# Patient Record
Sex: Female | Born: 1960
Health system: Southern US, Community
[De-identification: ages and names within clinical notes are randomized; demographics above are authoritative.]

## PROBLEM LIST (undated history)

## (undated) DIAGNOSIS — T7840XA Allergy, unspecified, initial encounter: Secondary | ICD-10-CM

## (undated) DIAGNOSIS — D649 Anemia, unspecified: Secondary | ICD-10-CM

## (undated) HISTORY — DX: Allergy, unspecified, initial encounter: T78.40XA

---
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
## 2013-09-22 ENCOUNTER — Encounter: Payer: Self-pay | Admitting: *Deleted

## 2013-09-24 ENCOUNTER — Ambulatory Visit (INDEPENDENT_AMBULATORY_CARE_PROVIDER_SITE_OTHER): Payer: BC Managed Care – PPO | Admitting: Cardiology

## 2013-09-24 ENCOUNTER — Ambulatory Visit: Payer: Self-pay | Admitting: Cardiology

## 2013-09-24 ENCOUNTER — Encounter: Payer: Self-pay | Admitting: Cardiology

## 2013-09-24 VITALS — BP 126/82 | HR 76 | Ht 61.0 in | Wt 138.0 lb

## 2013-09-24 DIAGNOSIS — R0789 Other chest pain: Secondary | ICD-10-CM

## 2013-09-24 MED ORDER — RANITIDINE HCL 150 MG PO TABS: 150.0000 mg | ORAL_TABLET | Freq: Two times a day (BID) | ORAL | Status: DC

## 2013-09-24 NOTE — Patient Instructions (Signed)
Your physician recommends that you schedule a follow-up appointment in: 3 months. Your physician has recommended you make the following change in your medication: Start zantac 150 mg twice daily. This medication is over the counter. All other medications will remain the same.

## 2013-09-24 NOTE — Progress Notes (Signed)
Clinical Summary [name] is a 53 y.o.female seen today as a new patient for chest pain.   1. Chest pain - pain started 1 month ago. Sharp or dull pain, 1/10. Mid to left chest, no other associated symptoms. Can occur at rest or with exertion. Nothing makes better or worst. No change in frequency or severity since onset. - denies any DOE, no orthopnea, no LE edema.  CAD: No HTN, no HL, no tobacco,  no DM. Mother age 54 with newly found coronary blockage.     No past medical history on file.   Allergies  Allergen Reactions  . Amoxicillin      Current Outpatient Prescriptions  Medication Sig Dispense Refill  . MISC NATURAL PRODUCTS PO Take 1 capsule by mouth 3 (three) times daily. cayan pepper      . Multiple Vitamin (MULTIVITAMIN) tablet Take 0.5 tablets by mouth 2 (two) times daily.      . Omega-3 Fatty Acids (FISH OIL) 1000 MG CAPS Take 1 capsule by mouth 3 (three) times daily.      . ranitidine (ZANTAC) 150 MG tablet Take 1 tablet (150 mg total) by mouth 2 (two) times daily.  60 tablet  6   No current facility-administered medications for this visit.     Past Surgical History  Procedure Laterality Date  . Cesarean section with bilateral tubal ligation  1995     Allergies  Allergen Reactions  . Amoxicillin       Family History  Problem Relation Age of Onset  . CAD Mother     95% blockages had cardiac catherization  . Hypertension Mother   . Cataracts Mother   . Lung cancer Father   . Arthritis Mother      Social History Rachel Brooks reports that she has never smoked. She does not have any smokeless tobacco history on file. [name] has no alcohol history on file.   Review of Systems CONSTITUTIONAL: No weight loss, fever, chills, weakness or fatigue.  HEENT: Eyes: No visual loss, blurred vision, double vision or yellow sclerae.No hearing loss, sneezing, congestion, runny nose or sore throat.  SKIN: No rash or itching.  CARDIOVASCULAR: per  HPI RESPIRATORY: No shortness of breath, cough or sputum.  GASTROINTESTINAL: No anorexia, nausea, vomiting or diarrhea. No abdominal pain or blood.  GENITOURINARY: No burning on urination, no polyuria NEUROLOGICAL: No headache, dizziness, syncope, paralysis, ataxia, numbness or tingling in the extremities. No change in bowel or bladder control.  MUSCULOSKELETAL: No muscle, back pain, joint pain or stiffness.  LYMPHATICS: No enlarged nodes. No history of splenectomy.  PSYCHIATRIC: No history of depression or anxiety.  ENDOCRINOLOGIC: No reports of sweating, cold or heat intolerance. No polyuria or polydipsia.  Marland Kitchen.   Physical Examination Filed Vitals:   09/24/13 1437  BP: 126/82  Pulse: 76   Filed Weights   09/24/13 1437  Weight: 138 lb (62.596 kg)    Gen: resting comfortably, no acute distress HEENT: no scleral icterus, pupils equal round and reactive, no palptable cervical adenopathy,  CV: RRR, no m/r/g, no  JVD Resp: Clear to auscultation bilaterally GI: abdomen is soft, non-tender, non-distended, normal bowel sounds, no hepatosplenomegaly MSK: extremities are warm, no edema.  Skin: warm, no rash Neuro:  no focal deficits Psych: appropriate affect   Diagnostic Studies EKG 09/13/13: NSR    Assessment and Plan  1. Atypical chest pain - atypical chest pain, no significant risk factors - do not recommend further cardiac workup at  this time - f/u 3 months to reassess symptoms - start zantac emperically for possible GI etiology.       Antoine Poche, M.D., F.A.C.C.

## 2013-10-25 DIAGNOSIS — R0789 Other chest pain: Secondary | ICD-10-CM | POA: Insufficient documentation

## 2013-12-27 ENCOUNTER — Ambulatory Visit: Payer: BC Managed Care – PPO | Admitting: Cardiology

## 2015-06-26 DIAGNOSIS — D649 Anemia, unspecified: Secondary | ICD-10-CM | POA: Insufficient documentation

## 2015-06-26 DIAGNOSIS — Z780 Asymptomatic menopausal state: Secondary | ICD-10-CM | POA: Insufficient documentation

## 2015-06-26 DIAGNOSIS — R7309 Other abnormal glucose: Secondary | ICD-10-CM | POA: Insufficient documentation

## 2015-06-26 DIAGNOSIS — Z1211 Encounter for screening for malignant neoplasm of colon: Secondary | ICD-10-CM | POA: Insufficient documentation

## 2015-06-26 DIAGNOSIS — Z0000 Encounter for general adult medical examination without abnormal findings: Secondary | ICD-10-CM | POA: Insufficient documentation

## 2015-06-26 HISTORY — DX: Anemia, unspecified: D64.9

## 2016-12-02 DIAGNOSIS — Z23 Encounter for immunization: Secondary | ICD-10-CM | POA: Insufficient documentation

## 2018-02-18 DIAGNOSIS — R21 Rash and other nonspecific skin eruption: Secondary | ICD-10-CM | POA: Diagnosis not present

## 2018-03-20 DIAGNOSIS — R21 Rash and other nonspecific skin eruption: Secondary | ICD-10-CM | POA: Diagnosis not present

## 2018-04-30 DIAGNOSIS — L508 Other urticaria: Secondary | ICD-10-CM | POA: Diagnosis not present

## 2018-06-09 DIAGNOSIS — Z6825 Body mass index (BMI) 25.0-25.9, adult: Secondary | ICD-10-CM | POA: Diagnosis not present

## 2018-06-09 DIAGNOSIS — Z1231 Encounter for screening mammogram for malignant neoplasm of breast: Secondary | ICD-10-CM | POA: Diagnosis not present

## 2018-06-09 DIAGNOSIS — Z01419 Encounter for gynecological examination (general) (routine) without abnormal findings: Secondary | ICD-10-CM | POA: Diagnosis not present

## 2018-06-12 LAB — HM PAP SMEAR: HM Pap smear: NEGATIVE

## 2018-12-30 DIAGNOSIS — Z880 Allergy status to penicillin: Secondary | ICD-10-CM | POA: Diagnosis not present

## 2018-12-30 DIAGNOSIS — K529 Noninfective gastroenteritis and colitis, unspecified: Secondary | ICD-10-CM | POA: Diagnosis not present

## 2018-12-30 DIAGNOSIS — R109 Unspecified abdominal pain: Secondary | ICD-10-CM | POA: Diagnosis not present

## 2018-12-30 DIAGNOSIS — R7303 Prediabetes: Secondary | ICD-10-CM | POA: Diagnosis not present

## 2018-12-30 DIAGNOSIS — R112 Nausea with vomiting, unspecified: Secondary | ICD-10-CM | POA: Diagnosis not present

## 2018-12-30 DIAGNOSIS — R1032 Left lower quadrant pain: Secondary | ICD-10-CM | POA: Diagnosis not present

## 2019-03-26 ENCOUNTER — Other Ambulatory Visit: Payer: Self-pay

## 2019-03-26 DIAGNOSIS — U071 COVID-19: Secondary | ICD-10-CM | POA: Diagnosis not present

## 2019-03-27 LAB — NOVEL CORONAVIRUS, NAA: SARS-CoV-2, NAA: NOT DETECTED

## 2019-03-31 ENCOUNTER — Ambulatory Visit: Payer: Self-pay | Admitting: Family Medicine

## 2019-04-13 ENCOUNTER — Other Ambulatory Visit: Payer: Self-pay

## 2019-04-14 ENCOUNTER — Ambulatory Visit: Payer: Self-pay | Admitting: Family Medicine

## 2019-05-11 ENCOUNTER — Other Ambulatory Visit: Payer: Self-pay

## 2019-05-13 ENCOUNTER — Encounter: Payer: Self-pay | Admitting: Family Medicine

## 2019-05-13 ENCOUNTER — Ambulatory Visit (INDEPENDENT_AMBULATORY_CARE_PROVIDER_SITE_OTHER): Payer: BC Managed Care – PPO | Admitting: Family Medicine

## 2019-05-13 ENCOUNTER — Other Ambulatory Visit: Payer: Self-pay

## 2019-05-13 VITALS — BP 123/83 | HR 87 | Temp 98.0°F | Wt 150.0 lb

## 2019-05-13 DIAGNOSIS — Z0000 Encounter for general adult medical examination without abnormal findings: Secondary | ICD-10-CM

## 2019-05-13 DIAGNOSIS — Z1211 Encounter for screening for malignant neoplasm of colon: Secondary | ICD-10-CM | POA: Diagnosis not present

## 2019-05-13 DIAGNOSIS — Z23 Encounter for immunization: Secondary | ICD-10-CM

## 2019-05-13 MED ORDER — SHINGRIX 50 MCG/0.5ML IM SUSR: 0.5000 mL | Freq: Once | INTRAMUSCULAR | 0 refills | Status: AC

## 2019-05-13 NOTE — Patient Instructions (Signed)

## 2019-05-13 NOTE — Progress Notes (Signed)
New Patient Office Visit  Assessment & Plan:  1. Healthcare maintenance - Mammogram result requested from [facility]. Patient declined HIV and Hep C screening. Declined any lab work today.   2. Colon cancer screening - Ambulatory referral to Gastroenterology  3. Immunization due - SHINGRIX injection; Inject 0.5 mLs into the muscle once for 1 dose.  Dispense: 0.5 mL; Refill: 0   Follow-up: Return for annual physical.   Hendricks Limes, MSN, APRN, FNP-C [facility]  Subjective:  Patient ID: Rachel Brooks, female    DOB: 02-09-1961  Age: 58 y.o. MRN: [id number]  Patient Care Team: Loman Brooklyn, FNP as PCP - General (Family Medicine) Nat Math, MD as Referring Physician (Obstetrics and Gynecology)  CC:  Chief Complaint  Patient presents with  . New Patient (Initial Visit)    HPI Rachel Brooks presents to establish care. She is transferring care from Dr. Murrell Redden office as he has retired and the office has closed.   Patient has no complaints/concerns today.   Review of Systems  Constitutional: Negative for chills, fever, malaise/fatigue and weight loss.  HENT: Negative for congestion, ear discharge, ear pain, nosebleeds, sinus pain, sore throat and tinnitus.   Eyes: Positive for blurred vision. Negative for double vision, pain, discharge and redness.  Respiratory: Negative for cough, shortness of breath and wheezing.   Cardiovascular: Negative for chest pain, palpitations and leg swelling.  Gastrointestinal: Negative for abdominal pain, constipation, diarrhea, heartburn, nausea and vomiting.  Genitourinary: Negative for dysuria, frequency and urgency.  Musculoskeletal: Negative for myalgias.  Skin: Negative for rash.  Neurological: Negative for dizziness, seizures, weakness and headaches.  Psychiatric/Behavioral: Negative for depression, substance abuse and suicidal ideas. The patient is not nervous/anxious.     Current Outpatient  Medications:  .  SHINGRIX injection, Inject 0.5 mLs into the muscle once for 1 dose., Disp: 0.5 mL, Rfl: 0  Allergies  Allergen Reactions  . Amoxicillin     Past Medical History:  Diagnosis Date  . Allergy   . Anemia, unspecified 06/26/2015   10/1 IMO update    Past Surgical History:  Procedure Laterality Date  . CESAREAN SECTION WITH BILATERAL TUBAL LIGATION  1995    Family History  Problem Relation Age of Onset  . CAD Mother        95% blockages had cardiac catherization  . Hypertension Mother   . Cataracts Mother   . Arthritis Mother   . Lung cancer Father   . Colon polyps Sister     Social History   Socioeconomic History  . Marital status: Married    Spouse name: Not on file  . Number of children: Not on file  . Years of education: Not on file  . Highest education level: Not on file  Occupational History  . Not on file  Social Needs  . Financial resource strain: Not on file  . Food insecurity    Worry: Not on file    Inability: Not on file  . Transportation needs    Medical: Not on file    Non-medical: Not on file  Tobacco Use  . Smoking status: Never Smoker  . Smokeless tobacco: Never Used  Substance and Sexual Activity  . Alcohol use: Not Currently  . Drug use: Never  . Sexual activity: Not Currently  Lifestyle  . Physical activity    Days per week: Not on file    Minutes per session: Not on file  . Stress: Not on file  Relationships  .  Social Musician on phone: Not on file    Gets together: Not on file    Attends religious service: Not on file    Active member of club or organization: Not on file    Attends meetings of clubs or organizations: Not on file    Relationship status: Not on file  . Intimate partner violence    Fear of current or ex partner: Not on file    Emotionally abused: Not on file    Physically abused: Not on file    Forced sexual activity: Not on file  Other Topics Concern  . Not on file  Social History  Narrative  . Not on file    Objective:   Today's Vitals: BP 123/83   Pulse 87   Temp 98 F (36.7 C)   Wt 150 lb (68 kg)   BMI 28.34 kg/m   Physical Exam Vitals signs reviewed.  Constitutional:      General: She is not in acute distress.    Appearance: Normal appearance. She is overweight. She is not ill-appearing, toxic-appearing or diaphoretic.  HENT:     Head: Normocephalic and atraumatic.  Eyes:     General: No scleral icterus.       Right eye: No discharge.        Left eye: No discharge.     Conjunctiva/sclera: Conjunctivae normal.  Neck:     Musculoskeletal: Normal range of motion.  Cardiovascular:     Rate and Rhythm: Normal rate and regular rhythm.     Heart sounds: Normal heart sounds. No murmur. No friction rub. No gallop.   Pulmonary:     Effort: Pulmonary effort is normal. No respiratory distress.     Breath sounds: Normal breath sounds. No stridor. No wheezing, rhonchi or rales.  Musculoskeletal: Normal range of motion.  Skin:    General: Skin is warm and dry.     Capillary Refill: Capillary refill takes less than 2 seconds.  Neurological:     General: No focal deficit present.     Mental Status: She is alert and oriented to person, place, and time. Mental status is at baseline.  Psychiatric:        Mood and Affect: Mood normal.        Behavior: Behavior normal.        Thought Content: Thought content normal.        Judgment: Judgment normal.

## 2019-05-18 ENCOUNTER — Encounter: Payer: Self-pay | Admitting: Internal Medicine

## 2019-06-21 ENCOUNTER — Other Ambulatory Visit: Payer: Self-pay

## 2019-06-21 ENCOUNTER — Ambulatory Visit (INDEPENDENT_AMBULATORY_CARE_PROVIDER_SITE_OTHER): Payer: Self-pay | Admitting: *Deleted

## 2019-06-21 DIAGNOSIS — Z1211 Encounter for screening for malignant neoplasm of colon: Secondary | ICD-10-CM

## 2019-06-21 MED ORDER — PEG 3350-KCL-NA BICARB-NACL 420 G PO SOLR: 4000.0000 mL | Freq: Once | ORAL | 0 refills | Status: AC

## 2019-06-21 NOTE — Patient Instructions (Signed)
Rachel Brooks   Dec 04, 1960 MRN: [id number]    Procedure Date: 09/15/2019 Time to register: 12:00 pm Place to register: [facility] Procedure Time: 1:00 pm Scheduled provider: Dr. Gala Romney  PREPARATION FOR COLONOSCOPY WITH TRI-LYTE SPLIT PREP  Please notify us immediately if you are diabetic, take iron supplements, or if you are on Coumadin or any other blood thinners.    You will need to purchase 1 fleet enema and 1 box of Bisacodyl '5mg'$  tablets.   2 DAYS BEFORE PROCEDURE:  DATE: 09/13/2019   DAY: Monday Begin clear liquid diet AFTER your lunch meal. NO SOLID FOODS after this point.  1 DAY BEFORE PROCEDURE:  DATE: 09/14/2019   DAY: Tuesday Continue clear liquids the entire day - NO SOLID FOOD.    At 2:00 pm:  Take 2 Bisacodyl tablets.   At 4:00pm:  Start drinking your solution. Make sure you mix well per instructions on the bottle. Try to drink 1 (one) 8 ounce glass every 10-15 minutes until you have consumed HALF the jug. You should complete by 6:00pm.You must keep the left over solution refrigerated until completed next day.  Continue clear liquids. You must drink plenty of clear liquids to prevent dehyration and kidney failure.     DAY OF PROCEDURE:   DATE: 09/15/2019  DAY: Wednesday If you take medications for your heart, blood pressure or breathing, you may take these medications.   Five hours before your procedure time @ 8:00 am:  Finish remaining amout of bowel prep, drinking 1 (one) 8 ounce glass every 10-15 minutes until complete. You have two hours to consume remaining prep.   Three hours before your procedure time @ 10:00 am:  Nothing by mouth.   At least one hour before going to the hospital:  Give yourself one Fleet enema. You may take your morning medications with sip of water unless we have instructed otherwise.      Please see below for Dietary Information.  CLEAR LIQUIDS INCLUDE:  Water Jello (NOT red in color)   Ice Popsicles (NOT red in color)    Tea (sugar ok, no milk/cream) Powdered fruit flavored drinks  Coffee (sugar ok, no milk/cream) Gatorade/ Lemonade/ Kool-Aid  (NOT red in color)   Juice: apple, white grape, white cranberry Soft drinks  Clear bullion, consomme, broth (fat free beef/chicken/vegetable)  Carbonated beverages (any kind)  Strained chicken noodle soup Hard Candy   Remember: Clear liquids are liquids that will allow you to see your fingers on the other side of a clear glass. Be sure liquids are NOT red in color, and not cloudy, but CLEAR.  DO NOT EAT OR DRINK ANY OF THE FOLLOWING:  Dairy products of any kind   Cranberry juice Tomato juice / V8 juice   Grapefruit juice Orange juice     Red grape juice  Do not eat any solid foods, including such foods as: cereal, oatmeal, yogurt, fruits, vegetables, creamed soups, eggs, bread, crackers, pureed foods in a blender, etc.   HELPFUL HINTS FOR DRINKING PREP SOLUTION:   Make sure prep is extremely cold. Mix and refrigerate the the morning of the prep. You may also put in the freezer.   You may try mixing some Crystal Light or Country Time Lemonade if you prefer. Mix in small amounts; add more if necessary.  Try drinking through a straw  Rinse mouth with water or a mouthwash between glasses, to remove after-taste.  Try sipping on a cold beverage /ice/ popsicles between glasses of  prep.  Place a piece of sugar-free hard candy in mouth between glasses.  If you become nauseated, try consuming smaller amounts, or stretch out the time between glasses. Stop for 30-60 minutes, then slowly start back drinking.        OTHER INSTRUCTIONS  You will need a responsible adult at least 59 years of age to accompany you and drive you home. This person must remain in the waiting room during your procedure. The hospital will cancel your procedure if you do not have a responsible adult with you.   1. Wear loose fitting clothing that is easily removed. 2. Leave jewelry and  other valuables at home.  3. Remove all body piercing jewelry and leave at home. 4. Total time from sign-in until discharge is approximately 2-3 hours. 5. You should go home directly after your procedure and rest. You can resume normal activities the day after your procedure. 6. The day of your procedure you should not:  Drive  Make legal decisions  Operate machinery  Drink alcohol  Return to work   You may call the office (Dept: [phone]) before 5:00pm, or page the doctor on call ([phone]) after 5:00pm, for further instructions, if necessary.   Insurance Information YOU WILL NEED TO CHECK WITH YOUR INSURANCE COMPANY FOR THE BENEFITS OF COVERAGE YOU HAVE FOR THIS PROCEDURE.  UNFORTUNATELY, NOT ALL INSURANCE COMPANIES HAVE BENEFITS TO COVER ALL OR PART OF THESE TYPES OF PROCEDURES.  IT IS YOUR RESPONSIBILITY TO CHECK YOUR BENEFITS, HOWEVER, WE WILL BE GLAD TO ASSIST YOU WITH ANY CODES YOUR INSURANCE COMPANY MAY NEED.    PLEASE NOTE THAT MOST INSURANCE COMPANIES WILL NOT COVER A SCREENING COLONOSCOPY FOR PEOPLE UNDER THE AGE OF 50  IF YOU HAVE BCBS INSURANCE, YOU MAY HAVE BENEFITS FOR A SCREENING COLONOSCOPY BUT IF POLYPS ARE FOUND THE DIAGNOSIS WILL CHANGE AND THEN YOU MAY HAVE A DEDUCTIBLE THAT WILL NEED TO BE MET. SO PLEASE MAKE SURE YOU CHECK YOUR BENEFITS FOR A SCREENING COLONOSCOPY AS WELL AS A DIAGNOSTIC COLONOSCOPY.  

## 2019-06-21 NOTE — Progress Notes (Signed)
Gastroenterology Pre-Procedure Review  Request Date: 06/21/2019 Requesting Physician: Hendricks Limes, NP @ [facility], Last TCS done about 9 years ago in [facility], pt could not remember who did it, no polyps per pt  PATIENT REVIEW QUESTIONS: The patient responded to the following health history questions as indicated:    1. Diabetes Melitis: no 2. Joint replacements in the past 12 months:no 3. Major health problems in the past 3 months: no 4. Has an artificial valve or MVP: no 5. Has a defibrillator: no 6. Has been advised in past to take antibiotics in advance of a procedure like teeth cleaning: no 7. Family history of colon cancer: no  8. Alcohol Use: no 9. Illicit drug Use: no 10. History of sleep apnea: no  11. History of coronary artery or other vascular stents placed within the last 12 months: no 12. History of any prior anesthesia complications: no 13. There is no height or weight on file to calculate BMI.ht: 5'1 wt: 150 lbs    MEDICATIONS & ALLERGIES:    Patient reports the following regarding taking any blood thinners:   Plavix? no Aspirin? no Coumadin? no Brilinta? no Xarelto? no Eliquis? no Pradaxa? no Savaysa? no Effient? no  Patient confirms/reports the following medications:  Current Outpatient Medications  Medication Sig Dispense Refill  . Multiple Vitamins-Minerals (MULTIVITAMIN ADULT PO) Take by mouth daily.     No current facility-administered medications for this visit.     Patient confirms/reports the following allergies:  Allergies  Allergen Reactions  . Amoxicillin   . Penicillins Hives    No orders of the defined types were placed in this encounter.   AUTHORIZATION INFORMATION Primary Insurance: BCBS [place] ,  [place] #: [id number],  Group #: [id number] Pre-Cert / Auth required: No, not required  SCHEDULE INFORMATION: Procedure has been scheduled as follows:  Date: 09/15/2019, Time: 1:00 Location: [facility] with Dr. Gala Romney  This Gastroenterology  Pre-Precedure Review Form is being routed to the following provider(s): Neil Crouch, PA

## 2019-06-22 DIAGNOSIS — Z01419 Encounter for gynecological examination (general) (routine) without abnormal findings: Secondary | ICD-10-CM | POA: Diagnosis not present

## 2019-06-22 DIAGNOSIS — Z6828 Body mass index (BMI) 28.0-28.9, adult: Secondary | ICD-10-CM | POA: Diagnosis not present

## 2019-06-22 DIAGNOSIS — Z1231 Encounter for screening mammogram for malignant neoplasm of breast: Secondary | ICD-10-CM | POA: Diagnosis not present

## 2019-06-22 NOTE — Progress Notes (Signed)
Ok to schedule.

## 2019-06-22 NOTE — Addendum Note (Signed)
Addended by: Metro Kung on: 06/22/2019 08:53 AM   Modules accepted: Orders, SmartSet

## 2019-09-08 ENCOUNTER — Telehealth: Payer: Self-pay

## 2019-09-08 NOTE — Telephone Encounter (Signed)
PA for TCS scheduled for 09/15/19 submitted via [employer] website. Case pended. Reference# [id number].

## 2019-09-13 NOTE — Telephone Encounter (Signed)
TCS approved. Reference# [id number], valid 2/24-21-12/07/19.

## 2019-09-14 ENCOUNTER — Other Ambulatory Visit (HOSPITAL_COMMUNITY)
Admission: RE | Admit: 2019-09-14 | Discharge: 2019-09-14 | Disposition: A | Discharge status: Home / Self Care | Payer: 59 | Source: Ambulatory Visit | Attending: Internal Medicine | Admitting: Internal Medicine

## 2019-09-14 ENCOUNTER — Other Ambulatory Visit: Payer: Self-pay

## 2019-09-14 DIAGNOSIS — Z01812 Encounter for preprocedural laboratory examination: Secondary | ICD-10-CM | POA: Insufficient documentation

## 2019-09-14 DIAGNOSIS — Z20822 Contact with and (suspected) exposure to covid-19: Secondary | ICD-10-CM | POA: Diagnosis not present

## 2019-09-14 LAB — SARS CORONAVIRUS 2 (TAT 6-24 HRS): SARS Coronavirus 2: NEGATIVE

## 2019-09-15 ENCOUNTER — Encounter (HOSPITAL_COMMUNITY): Payer: Self-pay | Admitting: Internal Medicine

## 2019-09-15 ENCOUNTER — Encounter (HOSPITAL_COMMUNITY)
Admission: RE | Disposition: A | Discharge status: Home / Self Care | Payer: Self-pay | Source: Home / Self Care | Attending: Internal Medicine

## 2019-09-15 ENCOUNTER — Other Ambulatory Visit: Payer: Self-pay

## 2019-09-15 ENCOUNTER — Ambulatory Visit (HOSPITAL_COMMUNITY)
Admission: RE | Admit: 2019-09-15 | Discharge: 2019-09-15 | Disposition: A | Discharge status: Home / Self Care | Payer: 59 | Attending: Internal Medicine | Admitting: Internal Medicine

## 2019-09-15 DIAGNOSIS — K5730 Diverticulosis of large intestine without perforation or abscess without bleeding: Secondary | ICD-10-CM | POA: Insufficient documentation

## 2019-09-15 DIAGNOSIS — Z1211 Encounter for screening for malignant neoplasm of colon: Secondary | ICD-10-CM | POA: Diagnosis not present

## 2019-09-15 HISTORY — PX: COLONOSCOPY: SHX5424

## 2019-09-15 SURGERY — COLONOSCOPY
Anesthesia: Moderate Sedation

## 2019-09-15 MED ORDER — MEPERIDINE HCL 50 MG/ML IJ SOLN
INTRAMUSCULAR | Status: AC
  Filled 2019-09-15: qty 1

## 2019-09-15 MED ORDER — SODIUM CHLORIDE 0.9 % IV SOLN: INTRAVENOUS | Status: DC

## 2019-09-15 MED ORDER — MEPERIDINE HCL 100 MG/ML IJ SOLN
INTRAMUSCULAR | Status: DC | PRN
  Administered 2019-09-15: 15 mg via INTRAVENOUS
  Administered 2019-09-15: 25 mg via INTRAVENOUS

## 2019-09-15 MED ORDER — STERILE WATER FOR IRRIGATION IR SOLN
Status: DC | PRN
  Administered 2019-09-15: 100 mL

## 2019-09-15 MED ORDER — ONDANSETRON HCL 4 MG/2ML IJ SOLN
INTRAMUSCULAR | Status: AC
  Filled 2019-09-15: qty 2

## 2019-09-15 MED ORDER — MIDAZOLAM HCL 5 MG/5ML IJ SOLN
INTRAMUSCULAR | Status: AC
  Filled 2019-09-15: qty 10

## 2019-09-15 MED ORDER — MIDAZOLAM HCL 5 MG/5ML IJ SOLN
INTRAMUSCULAR | Status: DC | PRN
  Administered 2019-09-15 (×2): 2 mg via INTRAVENOUS
  Administered 2019-09-15: 1 mg via INTRAVENOUS

## 2019-09-15 MED ORDER — ONDANSETRON HCL 4 MG/2ML IJ SOLN
INTRAMUSCULAR | Status: DC | PRN
  Administered 2019-09-15: 4 mg via INTRAVENOUS

## 2019-09-15 NOTE — Op Note (Signed)
[facility] Patient Name: Rachel Brooks Procedure Date: 09/15/2019 11:41 AM MRN: [id number] Date of Birth: 04-19-1961 Attending MD: Gennette Pac , MD CSN: [id number] Age: 59 Admit Type: Outpatient Procedure:                Colonoscopy Indications:              Screening for colorectal malignant neoplasm Providers:                Gennette Pac, MD, Edrick Kins, RN, Dyann Ruddle Referring MD:              Medicines:                Midazolam 5 mg IV, Meperidine 40 mg IV, Ondansetron                            4 mg IV Complications:            No immediate complications. Estimated Blood Loss:     Estimated blood loss: none. Procedure:                Pre-Anesthesia Assessment:                           - Prior to the procedure, a History and Physical                            was performed, and patient medications and                            allergies were reviewed. The patient's tolerance of                            previous anesthesia was also reviewed. The risks                            and benefits of the procedure and the sedation                            options and risks were discussed with the patient.                            All questions were answered, and informed consent                            was obtained. Prior Anticoagulants: The patient has                            taken no previous anticoagulant or antiplatelet                            agents. ASA Grade Assessment: II - A patient with  mild systemic disease. After reviewing the risks                            and benefits, the patient was deemed in                            satisfactory condition to undergo the procedure.                           After obtaining informed consent, the colonoscope                            was passed under direct vision. Throughout the                            procedure, the patient's blood  pressure, pulse, and                            oxygen saturations were monitored continuously. The                            [id number]-[id number] ([id number]) scope was introduced through                            the anus and advanced to the the cecum, identified                            by appendiceal orifice and ileocecal valve. The                            colonoscopy was performed without difficulty. The                            patient tolerated the procedure well. The quality                            of the bowel preparation was adequate. The                            ileocecal valve, appendiceal orifice, and rectum                            were photographed. Scope In: 12:33:33 PM Scope Out: 12:44:15 PM Scope Withdrawal Time: 0 hours 7 minutes 6 seconds  Total Procedure Duration: 0 hours 10 minutes 42 seconds  Findings:      The perianal and digital rectal examinations were normal.      One medium-mouthed diverticulum was found in the ascending colon.      The exam was otherwise without abnormality on direct and retroflexion       views. Impression:               - Diverticulosis in the ascending colon.                           -  The examination was otherwise normal on direct                            and retroflexion views.                           - No specimens collected. Moderate Sedation:      Moderate (conscious) sedation was administered by the endoscopy nurse       and supervised by the endoscopist. The following parameters were       monitored: oxygen saturation, heart rate, blood pressure, respiratory       rate, EKG, adequacy of pulmonary ventilation, and response to care.       Total physician intraservice time was 17 minutes. Recommendation:           - Patient has a contact number available for                            emergencies. The signs and symptoms of potential                            delayed complications were discussed with the                             patient. Return to normal activities tomorrow.                            Written discharge instructions were provided to the                            patient.                           - Resume previous diet.                           - Continue present medications.                           - Repeat colonoscopy in 10 years for screening                            purposes.                           - Return to GI office PRN. Procedure Code(s):        --- Professional ---                           [phone], Colonoscopy, flexible; diagnostic, including                            collection of specimen(s) by brushing or washing,                            when performed (separate procedure)  G0500, Moderate sedation services provided by the                            same physician or other qualified health care                            professional performing a gastrointestinal                            endoscopic service that sedation supports,                            requiring the presence of an independent trained                            observer to assist in the monitoring of the                            patient's level of consciousness and physiological                            status; initial 15 minutes of intra-service time;                            patient age 89 years or older (additional time may                            be reported with 84166, as appropriate) Diagnosis Code(s):        --- Professional ---                           Z12.11, Encounter for screening for malignant                            neoplasm of colon                           K57.30, Diverticulosis of large intestine without                            perforation or abscess without bleeding CPT copyright 2019 American Medical Association. All rights reserved. The codes documented in this report are preliminary and upon coder review may  be revised to meet  current compliance requirements. Gerrit Friends. Athony Coppa, MD Gennette Pac, MD 09/15/2019 12:52:18 PM This report has been signed electronically. Number of Addenda: 0

## 2019-09-15 NOTE — H&P (Signed)
@LOGO @   Primary Care Physician:  Loman Brooklyn, FNP Primary Gastroenterologist:  Dr. Gala Romney  Pre-Procedure History & Physical: HPI:  Rachel Brooks is a 59 y.o. female is here for a screening colonoscopy.  Last colonoscopy 10 years ago elsewhere reportedly negative.  No GI symptoms.  No family history of colon cancer.  Past Medical History:  Diagnosis Date  . Allergy   . Anemia, unspecified 06/26/2015   10/1 IMO update    Past Surgical History:  Procedure Laterality Date  . CESAREAN SECTION WITH BILATERAL TUBAL LIGATION  1995    Prior to Admission medications   Medication Sig Start Date End Date Taking? Authorizing Provider  Biotin w/ Vitamins C & E (HAIR/SKIN/NAILS PO) Take 2 tablets by mouth daily. Gummies   Yes [provider]  Cholecalciferol (VITAMIN D3 PO) Take 1 capsule by mouth daily.   Yes [provider]  EVENING PRIMROSE OIL PO Take 1 capsule by mouth daily.   Yes [provider]    Allergies as of 06/22/2019 - Review Complete 06/21/2019  Allergen Reaction Noted  . Amoxicillin  09/22/2013  . Penicillins Hives 06/21/2019    Family History  Problem Relation Age of Onset  . CAD Mother        95% blockages had cardiac catherization  . Hypertension Mother   . Cataracts Mother   . Arthritis Mother   . Lung cancer Father   . Colon polyps Sister     Social History   Socioeconomic History  . Marital status: Divorced    Spouse name: Not on file  . Number of children: Not on file  . Years of education: Not on file  . Highest education level: Not on file  Occupational History  . Not on file  Tobacco Use  . Smoking status: Never Smoker  . Smokeless tobacco: Never Used  Substance and Sexual Activity  . Alcohol use: Not Currently  . Drug use: Never  . Sexual activity: Not Currently  Other Topics Concern  . Not on file  Social History Narrative  . Not on file   Social Determinants of Health   Financial Resource Strain:   .  Difficulty of Paying Living Expenses: Not on file  Food Insecurity:   . Worried About Charity fundraiser in the Last Year: Not on file  . Ran Out of Food in the Last Year: Not on file  Transportation Needs:   . Lack of Transportation (Medical): Not on file  . Lack of Transportation (Non-Medical): Not on file  Physical Activity:   . Days of Exercise per Week: Not on file  . Minutes of Exercise per Session: Not on file  Stress:   . Feeling of Stress : Not on file  Social Connections:   . Frequency of Communication with Friends and Family: Not on file  . Frequency of Social Gatherings with Friends and Family: Not on file  . Attends Religious Services: Not on file  . Active Member of Clubs or Organizations: Not on file  . Attends Archivist Meetings: Not on file  . Marital Status: Not on file  Intimate Partner Violence:   . Fear of Current or Ex-Partner: Not on file  . Emotionally Abused: Not on file  . Physically Abused: Not on file  . Sexually Abused: Not on file    Review of Systems: See HPI, otherwise negative ROS  Physical Exam: There were no vitals taken for this visit. General:   Alert,  Well-developed, well-nourished, pleasant and cooperative in NAD Lungs:  Clear throughout to auscultation.   No wheezes, crackles, or rhonchi. No acute distress. Heart:  Regular rate and rhythm; no murmurs, clicks, rubs,  or gallops. Abdomen:  Soft, nontender and nondistended. No masses, hepatosplenomegaly or hernias noted. Normal bowel sounds, without guarding, and without rebound.    Impression/Plan: Rachel Brooks is now here to undergo a screening colonoscopy.  Average rescreening examination.  Risks, benefits, limitations, imponderables and alternatives regarding colonoscopy have been reviewed with the patient. Questions have been answered. All parties agreeable.     Notice:  This dictation was prepared with Dragon dictation along with smaller phrase technology. Any  transcriptional errors that result from this process are unintentional and may not be corrected upon review.

## 2019-09-15 NOTE — Discharge Instructions (Signed)
  Colonoscopy Discharge Instructions  Read the instructions outlined below and refer to this sheet in the next few weeks. These discharge instructions provide you with general information on caring for yourself after you leave the hospital. Your doctor may also give you specific instructions. While your treatment has been planned according to the most current medical practices available, unavoidable complications occasionally occur. If you have any problems or questions after discharge, call Dr. Jena Gauss at [phone]. ACTIVITY  You may resume your regular activity, but move at a slower pace for the next 24 hours.   Take frequent rest periods for the next 24 hours.   Walking will help get rid of the air and reduce the bloated feeling in your belly (abdomen).   No driving for 24 hours (because of the medicine (anesthesia) used during the test).    Do not sign any important legal documents or operate any machinery for 24 hours (because of the anesthesia used during the test).  NUTRITION  Drink plenty of fluids.   You may resume your normal diet as instructed by your doctor.   Begin with a light meal and progress to your normal diet. Heavy or fried foods are harder to digest and may make you feel sick to your stomach (nauseated).   Avoid alcoholic beverages for 24 hours or as instructed.  MEDICATIONS  You may resume your normal medications unless your doctor tells you otherwise.  WHAT YOU CAN EXPECT TODAY  Some feelings of bloating in the abdomen.   Passage of more gas than usual.   Spotting of blood in your stool or on the toilet paper.  IF YOU HAD POLYPS REMOVED DURING THE COLONOSCOPY:  No aspirin products for 7 days or as instructed.   No alcohol for 7 days or as instructed.   Eat a soft diet for the next 24 hours.  FINDING OUT THE RESULTS OF YOUR TEST Not all test results are available during your visit. If your test results are not back during the visit, make an appointment  with your caregiver to find out the results. Do not assume everything is normal if you have not heard from your caregiver or the medical facility. It is important for you to follow up on all of your test results.  SEEK IMMEDIATE MEDICAL ATTENTION IF:  You have more than a spotting of blood in your stool.   Your belly is swollen (abdominal distention).   You are nauseated or vomiting.   You have a temperature over 101.   You have abdominal pain or discomfort that is severe or gets worse throughout the day.   Repeat colonoscopy for screening purposes in 10 years  At patient request I called a Enola Niccoli at [phone] and reviewed results.

## 2020-01-07 ENCOUNTER — Encounter: Payer: Self-pay | Admitting: Family

## 2020-01-07 ENCOUNTER — Telehealth (INDEPENDENT_AMBULATORY_CARE_PROVIDER_SITE_OTHER): Payer: 59 | Admitting: Family

## 2020-01-07 DIAGNOSIS — R21 Rash and other nonspecific skin eruption: Secondary | ICD-10-CM

## 2020-01-07 MED ORDER — TRIAMCINOLONE ACETONIDE 0.5 % EX OINT: 1.0000 "application " | TOPICAL_OINTMENT | Freq: Two times a day (BID) | CUTANEOUS | 0 refills | Status: DC

## 2020-01-07 NOTE — Progress Notes (Signed)
Virtual Visit via telephone Note Due to COVID-19 pandemic this visit was conducted virtually. This visit type was conducted due to national recommendations for restrictions regarding the COVID-19 Pandemic (e.g. social distancing, sheltering in place) in an effort to limit this patient's exposure and mitigate transmission in our community. All issues noted in this document were discussed and addressed.  A physical exam was not performed with this format.  I connected with Rachel Brooks on 01/07/20 at 2:14 pm  by telephone and video and verified that I am speaking with the correct person using two identifiers. Rachel Brooks is currently located at home and no one  is currently with her during visit. The provider, Evelina Dun, FNP is located in their office at time of visit.  I discussed the limitations, risks, security and privacy concerns of performing an evaluation and management service by telephone and the availability of in person appointments. I also discussed with the patient that there may be a patient responsible charge related to this service. The patient expressed understanding and agreed to proceed.   History and Present Illness: Pt calls the office today with a erythemas rash on her left arm and axilla that started a week ago. She reports this looks very similar to when she got her COVID vaccine two months ago. She states the area may "itch a little", but no pain, swelling, or fever. She reports it looks like it is "drying out". Denies any new contacts, new medications,  Rash This is a new problem. The current episode started in the past 7 days. The problem is unchanged. The affected locations include the left axilla and left arm. The rash is characterized by redness and dryness. She was exposed to nothing. Pertinent negatives include no congestion, cough, diarrhea, eye pain, facial edema, fatigue, fever, joint pain, rhinorrhea, shortness of breath, sore throat or vomiting. Treatments  tried: lotion. The treatment provided no relief.      Review of Systems  Constitutional: Negative for fatigue and fever.  HENT: Negative for congestion, rhinorrhea and sore throat.   Eyes: Negative for pain.  Respiratory: Negative for cough and shortness of breath.   Gastrointestinal: Negative for diarrhea and vomiting.  Musculoskeletal: Negative for joint pain.  Skin: Positive for rash.  All other systems reviewed and are negative.    Observations/Objective: No SOB or distress noted, erythemas rash in left axilla that extends around bicep.  Assessment and Plan: 1. Rash and nonspecific skin eruption Contact dermatitis? Yeast? She states the area does not itch.  Keep clean and dry Let use know if area becomes worsen  - triamcinolone ointment (KENALOG) 0.5 %; Apply 1 application topically 2 (two) times daily.  Dispense: 30 g; Refill: 0    I discussed the assessment and treatment plan with the patient. The patient was provided an opportunity to ask questions and all were answered. The patient agreed with the plan and demonstrated an understanding of the instructions.   The patient was advised to call back or seek an in-person evaluation if the symptoms worsen or if the condition fails to improve as anticipated.  The above assessment and management plan was discussed with the patient. The patient verbalized understanding of and has agreed to the management plan. Patient is aware to call the clinic if symptoms persist or worsen. Patient is aware when to return to the clinic for a follow-up visit. Patient educated on when it is appropriate to go to the emergency department.   Time call ended:  2:27  pm   I provided 13 minutes of non- and face-to-face time during this encounter.    Rachel Rodney, FNP

## 2020-05-02 ENCOUNTER — Ambulatory Visit (INDEPENDENT_AMBULATORY_CARE_PROVIDER_SITE_OTHER): Payer: 59 | Admitting: Nurse Practitioner

## 2020-05-02 ENCOUNTER — Other Ambulatory Visit: Payer: Self-pay

## 2020-05-02 ENCOUNTER — Encounter: Payer: Self-pay | Admitting: Nurse Practitioner

## 2020-05-02 VITALS — BP 138/85 | HR 83 | Temp 97.9°F | Ht 61.0 in | Wt 158.6 lb

## 2020-05-02 DIAGNOSIS — Z0000 Encounter for general adult medical examination without abnormal findings: Secondary | ICD-10-CM | POA: Diagnosis not present

## 2020-05-02 DIAGNOSIS — Z23 Encounter for immunization: Secondary | ICD-10-CM | POA: Diagnosis not present

## 2020-05-02 DIAGNOSIS — Z780 Asymptomatic menopausal state: Secondary | ICD-10-CM

## 2020-05-02 NOTE — Patient Instructions (Signed)
Health Maintenance, Female Adopting a healthy lifestyle and getting preventive care are important in promoting health and wellness. Ask your health care provider about:  The right schedule for you to have regular tests and exams.  Things you can do on your own to prevent diseases and keep yourself healthy. What should I know about diet, weight, and exercise? Eat a healthy diet   Eat a diet that includes plenty of vegetables, fruits, low-fat dairy products, and lean protein.  Do not eat a lot of foods that are high in solid fats, added sugars, or sodium. Maintain a healthy weight Body mass index (BMI) is used to identify weight problems. It estimates body fat based on height and weight. Your health care provider can help determine your BMI and help you achieve or maintain a healthy weight. Get regular exercise Get regular exercise. This is one of the most important things you can do for your health. Most adults should:  Exercise for at least 150 minutes each week. The exercise should increase your heart rate and make you sweat (moderate-intensity exercise).  Do strengthening exercises at least twice a week. This is in addition to the moderate-intensity exercise.  Spend less time sitting. Even light physical activity can be beneficial. Watch cholesterol and blood lipids Have your blood tested for lipids and cholesterol at 60 years of age, then have this test every 5 years. Have your cholesterol levels checked more often if:  Your lipid or cholesterol levels are high.  You are older than 60 years of age.  You are at high risk for heart disease. What should I know about cancer screening? Depending on your health history and family history, you may need to have cancer screening at various ages. This may include screening for:  Breast cancer.  Cervical cancer.  Colorectal cancer.  Skin cancer.  Lung cancer. What should I know about heart disease, diabetes, and high blood  pressure? Blood pressure and heart disease  High blood pressure causes heart disease and increases the risk of stroke. This is more likely to develop in people who have high blood pressure readings, are of African descent, or are overweight.  Have your blood pressure checked: ? Every 3-5 years if you are 18-39 years of age. ? Every year if you are 40 years old or older. Diabetes Have regular diabetes screenings. This checks your fasting blood sugar level. Have the screening done:  Once every three years after age 40 if you are at a normal weight and have a low risk for diabetes.  More often and at a younger age if you are overweight or have a high risk for diabetes. What should I know about preventing infection? Hepatitis B If you have a higher risk for hepatitis B, you should be screened for this virus. Talk with your health care provider to find out if you are at risk for hepatitis B infection. Hepatitis C Testing is recommended for:  Everyone born from 1945 through 1965.  Anyone with known risk factors for hepatitis C. Sexually transmitted infections (STIs)  Get screened for STIs, including gonorrhea and chlamydia, if: ? You are sexually active and are younger than 60 years of age. ? You are older than 60 years of age and your health care provider tells you that you are at risk for this type of infection. ? Your sexual activity has changed since you were last screened, and you are at increased risk for chlamydia or gonorrhea. Ask your health care provider if   you are at risk.  Ask your health care provider about whether you are at high risk for HIV. Your health care provider may recommend a prescription medicine to help prevent HIV infection. If you choose to take medicine to prevent HIV, you should first get tested for HIV. You should then be tested every 3 months for as long as you are taking the medicine. Pregnancy  If you are about to stop having your period (premenopausal) and  you may become pregnant, seek counseling before you get pregnant.  Take 400 to 800 micrograms (mcg) of folic acid every day if you become pregnant.  Ask for birth control (contraception) if you want to prevent pregnancy. Osteoporosis and menopause Osteoporosis is a disease in which the bones lose minerals and strength with aging. This can result in bone fractures. If you are 65 years old or older, or if you are at risk for osteoporosis and fractures, ask your health care provider if you should:  Be screened for bone loss.  Take a calcium or vitamin D supplement to lower your risk of fractures.  Be given hormone replacement therapy (HRT) to treat symptoms of menopause. Follow these instructions at home: Lifestyle  Do not use any products that contain nicotine or tobacco, such as cigarettes, e-cigarettes, and chewing tobacco. If you need help quitting, ask your health care provider.  Do not use street drugs.  Do not share needles.  Ask your health care provider for help if you need support or information about quitting drugs. Alcohol use  Do not drink alcohol if: ? Your health care provider tells you not to drink. ? You are pregnant, may be pregnant, or are planning to become pregnant.  If you drink alcohol: ? Limit how much you use to 0-1 drink a day. ? Limit intake if you are breastfeeding.  Be aware of how much alcohol is in your drink. In the [place] one drink equals one 12 oz bottle of beer (355 mL), one 5 oz glass of wine (148 mL), or one 1 oz glass of hard liquor (44 mL). General instructions  Schedule regular health, dental, and eye exams.  Stay current with your vaccines.  Tell your health care provider if: ? You often feel depressed. ? You have ever been abused or do not feel safe at home. Summary  Adopting a healthy lifestyle and getting preventive care are important in promoting health and wellness.  Follow your health care provider's instructions about healthy  diet, exercising, and getting tested or screened for diseases.  Follow your health care provider's instructions on monitoring your cholesterol and blood pressure. This information is not intended to replace advice given to you by your health care provider. Make sure you discuss any questions you have with your health care provider. Document Revised: 07/01/2018 Document Reviewed: 07/01/2018 Elsevier Patient Education  2020 [employer]  

## 2020-05-02 NOTE — Assessment & Plan Note (Signed)
Patient is a pleasant 59 year old female who is in clinic today for an annual physical exam.  Patient is healthy with no new concerns, head to toe assessment completed.  Patient completed flu vaccine today.  Annual mammogram scheduled for December along with Pap.  Provided education for health maintenance and preventative care.  Printed handouts provided to patient.  Patient did report a bee sting yesterday with a red rash on her back, assessed bee sting advised patient to apply an anti-itch cream, use over-the-counter antihistamine.  Advised patient to get Depo-Medrol shot, patient is not willing to do that at this time.  Advised patient to follow-up with worsening or unresolved symptoms from this time.  Follow-up in 1 year for annual checkup.

## 2020-05-02 NOTE — Assessment & Plan Note (Signed)
Patient is a 59 year old female who presents to clinic today for an annual physical.  Patient is not experiencing any postmenopausal signs or symptoms.

## 2020-05-02 NOTE — Progress Notes (Signed)
Established Patient Office Visit  Subjective:  Patient ID: Rachel Brooks, female    DOB: June 11, 1961  Age: 59 y.o. MRN: [id number]  CC:  Chief Complaint  Patient presents with  . Annual Exam    HPI [name] presents for .   Encounter for general adult medical examination without abnormal findings  Physical: Patient's last physical exam was 1 year ago .  Weight: Not appropriate for height (BMI greater than 27%) ;  Blood Pressure: Normal (BP less than 120/80) ; 138/85 Medical History: Patient history reviewed ; Family history reviewed ;  Allergies Reviewed: No change in current allergies ;  Medications Reviewed: Medications reviewed - no changes ;  Lipids: Normal lipid levels ;  Smoking: Life-long non-smoker ;  Physical Activity: Exercises at least 3 times per week ; walking Alcohol/Drug Use: Is a occasional drinker ; No illicit drug use ;  Patient is  afflicted from Stress Incontinence and Urge Incontinence sometimes. Safety: reviewed ; Patient wears a seat belt, has smoke detectors, has carbon monoxide detectors, and wears sunscreen with extended sun exposure. Dental Care: biannual cleanings, brushes and flosses daily. Ophthalmology/Optometry: Annual visit.  Hearing loss: none Vision impairments: none  Past Medical History:  Diagnosis Date  . Allergy   . Anemia, unspecified 06/26/2015   10/1 IMO update    Past Surgical History:  Procedure Laterality Date  . CESAREAN SECTION WITH BILATERAL TUBAL LIGATION  1995  . COLONOSCOPY N/A 09/15/2019   Procedure: COLONOSCOPY;  Surgeon: Corbin Ade, MD;  Location: [facility];  Service: Endoscopy;  Laterality: N/A;  1:00    Family History  Problem Relation Age of Onset  . CAD Mother        95% blockages had cardiac catherization  . Hypertension Mother   . Cataracts Mother   . Arthritis Mother   . Lung cancer Father   . Colon polyps Sister     Social History   Socioeconomic History  . Marital status: Divorced     Spouse name: Not on file  . Number of children: Not on file  . Years of education: Not on file  . Highest education level: Not on file  Occupational History  . Not on file  Tobacco Use  . Smoking status: Never Smoker  . Smokeless tobacco: Never Used  Vaping Use  . Vaping Use: Never used  Substance and Sexual Activity  . Alcohol use: Not Currently  . Drug use: Never  . Sexual activity: Not Currently  Other Topics Concern  . Not on file  Social History Narrative  . Not on file   Social Determinants of Health                                                                         Outpatient Medications Prior to Visit  Medication Sig Dispense Refill  . Cholecalciferol (VITAMIN D3 PO) Take 1 capsule by mouth daily.    Marland Kitchen EVENING PRIMROSE OIL PO Take 1 capsule by mouth daily.    . Biotin w/ Vitamins C & E (HAIR/SKIN/NAILS PO) Take 2 tablets by mouth daily. Gummies    . triamcinolone ointment (KENALOG) 0.5 % Apply 1 application topically 2 (two) times daily. 30 g 0  No facility-administered medications prior to visit.    Allergies  Allergen Reactions  . Amoxicillin Hives    Did it involve swelling of the face/tongue/throat, SOB, or low BP? No Did it involve sudden or severe rash/hives, skin peeling, or any reaction on the inside of your mouth or nose? Yes Did you need to seek medical attention at a hospital or doctor's office? Yes When did it last happen?More than 10 years ago If all above answers are "NO", may proceed with cephalosporin use.   Marland Kitchen Penicillins Hives    Did it involve swelling of the face/tongue/throat, SOB, or low BP? No Did it involve sudden or severe rash/hives, skin peeling, or any reaction on the inside of your mouth or nose? Yes Did you need to seek medical attention at a hospital or doctor's office? Yes When did it last happen?More than 10 years ago If all above answers are "NO", may proceed with cephalosporin use.     ROS Review of Systems  Constitutional: Negative.   HENT: Negative.   Eyes: Negative.   Respiratory: Negative.   Cardiovascular: Negative.   Endocrine: Negative.   Genitourinary: Negative.   Skin: Positive for rash.       Bee sting  Neurological: Negative.   Psychiatric/Behavioral: Negative.       Objective:    Physical Exam Vitals reviewed.  Constitutional:      Appearance: Normal appearance. She is normal weight.  HENT:     Head: Normocephalic.     Right Ear: External ear normal. There is no impacted cerumen.     Left Ear: There is no impacted cerumen.     Nose: Nose normal. No congestion.     Mouth/Throat:     Mouth: Mucous membranes are moist.     Pharynx: Oropharynx is clear. No posterior oropharyngeal erythema.  Eyes:     Conjunctiva/sclera: Conjunctivae normal.  Cardiovascular:     Rate and Rhythm: Normal rate and regular rhythm.     Pulses: Normal pulses.     Heart sounds: Normal heart sounds.  Pulmonary:     Effort: Pulmonary effort is normal.     Breath sounds: Normal breath sounds.  Abdominal:     General: Bowel sounds are normal.     Tenderness: There is no abdominal tenderness.  Musculoskeletal:        General: No swelling or tenderness. Normal range of motion.     Cervical back: Normal range of motion. No tenderness.  Skin:    General: Skin is warm and dry.  Neurological:     Mental Status: She is alert and oriented to person, place, and time.  Psychiatric:        Mood and Affect: Mood normal.        Behavior: Behavior normal.     BP 138/85   Pulse 83   Temp 97.9 F (36.6 C) (Temporal)   Ht 5\' 1"  (1.549 m)   Wt 158 lb 9.6 oz (71.9 kg)   SpO2 98%   BMI 29.97 kg/m  Wt Readings from Last 3 Encounters:  05/02/20 158 lb 9.6 oz (71.9 kg)  09/15/19 145 lb (65.8 kg)  05/13/19 150 lb (68 kg)     Health Maintenance Due  Topic Date Due  . COVID-19 Vaccine (1) Never done  . INFLUENZA VACCINE  02/20/2020       Assessment & Plan:    Problem List Items Addressed This Visit      Other   Annual physical exam  Patient is a pleasant 59 year old female who is in clinic today for an annual physical exam.  Patient is healthy with no new concerns, head to toe assessment completed.  Patient completed flu vaccine today.  Annual mammogram scheduled for December along with Pap.  Provided education for health maintenance and preventative care.  Printed handouts provided to patient.  Patient did report a bee sting yesterday with a red rash on her back, assessed bee sting advised patient to apply an anti-itch cream, use over-the-counter antihistamine.  Advised patient to get Depo-Medrol shot, patient is not willing to do that at this time.  Advised patient to follow-up with worsening or unresolved symptoms from this time.  Follow-up in 1 year for annual checkup.      Relevant Orders   CBC with Differential   Comprehensive metabolic panel   Lipid Panel   Post-menopausal    Patient is a 59 year old female who presents to clinic today for an annual physical.  Patient is not experiencing any postmenopausal signs or symptoms.      Need for immunization against influenza - Primary   Relevant Orders   Flu Vaccine QUAD 36+ mos IM (Completed)       Follow-up: Return in about 1 year (around 05/02/2021).    Daryll Drown, NP

## 2020-05-03 LAB — COMPREHENSIVE METABOLIC PANEL
ALT: 18 IU/L (ref 0–32)
AST: 23 IU/L (ref 0–40)
Albumin/Globulin Ratio: 1.2 (ref 1.2–2.2)
Albumin: 4.1 g/dL (ref 3.8–4.9)
Alkaline Phosphatase: 101 IU/L (ref 44–121)
BUN/Creatinine Ratio: 15 (ref 12–28)
BUN: 13 mg/dL (ref 8–27)
Bilirubin Total: 0.3 mg/dL (ref 0.0–1.2)
CO2: 24 mmol/L (ref 20–29)
Calcium: 9.1 mg/dL (ref 8.7–10.3)
Chloride: 102 mmol/L (ref 96–106)
Creatinine, Ser: 0.87 mg/dL (ref 0.57–1.00)
GFR calc Af Amer: 84 mL/min/{1.73_m2} (ref >59)
GFR calc non Af Amer: 73 mL/min/{1.73_m2} (ref >59)
Globulin, Total: 3.5 g/dL (ref 1.5–4.5)
Glucose: 87 mg/dL (ref 65–99)
Potassium: 4.3 mmol/L (ref 3.5–5.2)
Sodium: 137 mmol/L (ref 134–144)
Total Protein: 7.6 g/dL (ref 6.0–8.5)

## 2020-05-03 LAB — LIPID PANEL
Chol/HDL Ratio: 2.8 ratio (ref 0.0–4.4)
Cholesterol, Total: 138 mg/dL (ref 100–199)
HDL: 50 mg/dL (ref >39)
LDL Chol Calc (NIH): 76 mg/dL (ref 0–99)
Triglycerides: 59 mg/dL (ref 0–149)
VLDL Cholesterol Cal: 12 mg/dL (ref 5–40)

## 2020-05-03 LAB — CBC WITH DIFFERENTIAL/PLATELET
Basophils Absolute: 0 10*3/uL (ref 0.0–0.2)
Basos: 1 %
EOS (ABSOLUTE): 0.2 10*3/uL (ref 0.0–0.4)
Eos: 5 %
Hematocrit: 36.6 % (ref 34.0–46.6)
Hemoglobin: 12.1 g/dL (ref 11.1–15.9)
Immature Grans (Abs): 0 10*3/uL (ref 0.0–0.1)
Immature Granulocytes: 0 %
Lymphocytes Absolute: 2 10*3/uL (ref 0.7–3.1)
Lymphs: 46 %
MCH: 29.4 pg (ref 26.6–33.0)
MCHC: 33.1 g/dL (ref 31.5–35.7)
MCV: 89 fL (ref 79–97)
Monocytes Absolute: 0.7 10*3/uL (ref 0.1–0.9)
Monocytes: 16 %
Neutrophils Absolute: 1.4 10*3/uL (ref 1.4–7.0)
Neutrophils: 32 %
Platelets: 274 10*3/uL (ref 150–450)
RBC: 4.11 x10E6/uL (ref 3.77–5.28)
RDW: 11.5 % — ABNORMAL LOW (ref 11.7–15.4)
WBC: 4.3 10*3/uL (ref 3.4–10.8)

## 2020-07-10 ENCOUNTER — Other Ambulatory Visit: Payer: 59

## 2021-03-13 ENCOUNTER — Other Ambulatory Visit: Payer: Self-pay | Admitting: Family Medicine

## 2021-03-13 DIAGNOSIS — Z1231 Encounter for screening mammogram for malignant neoplasm of breast: Secondary | ICD-10-CM

## 2021-03-19 ENCOUNTER — Other Ambulatory Visit: Payer: Self-pay

## 2021-03-19 ENCOUNTER — Ambulatory Visit (INDEPENDENT_AMBULATORY_CARE_PROVIDER_SITE_OTHER): Payer: 59 | Admitting: Nurse Practitioner

## 2021-03-19 ENCOUNTER — Encounter: Payer: Self-pay | Admitting: Nurse Practitioner

## 2021-03-19 VITALS — BP 149/85 | HR 84 | Temp 97.7°F | Ht 61.0 in | Wt 152.5 lb

## 2021-03-19 DIAGNOSIS — G609 Hereditary and idiopathic neuropathy, unspecified: Secondary | ICD-10-CM | POA: Diagnosis not present

## 2021-03-19 MED ORDER — PREDNISONE 10 MG (21) PO TBPK: ORAL_TABLET | ORAL | 0 refills | Status: DC

## 2021-03-19 NOTE — Progress Notes (Signed)
Acute Office Visit  Subjective:    Patient ID: Rachel Brooks, female    DOB: 04/19/1961, 60 y.o.   MRN: [id number]  Chief Complaint  Patient presents with   Numbness    HPI Patient is in today for  Pain  She reports recurrent rigth lateral thigh and knee pain. was not an injury that may have caused the pain. The pain started a few weeks ago and is staying constant. The pain does not radiate . The pain is described as burning and tingling, is moderate in intensity, occurring constantly. Symptoms are worse in the: morning, mid-day, afternoon  Aggravating factors: none Relieving factors: none.  She has tried NSAIDs with no relief.      Past Medical History:  Diagnosis Date   Allergy    Anemia, unspecified 06/26/2015   10/1 IMO update    Past Surgical History:  Procedure Laterality Date   CESAREAN SECTION WITH BILATERAL TUBAL LIGATION  1995   COLONOSCOPY N/A 09/15/2019   Procedure: COLONOSCOPY;  Surgeon: Corbin Ade, MD;  Location: [facility];  Service: Endoscopy;  Laterality: N/A;  1:00    Family History  Problem Relation Age of Onset   CAD Mother        95% blockages had cardiac catherization   Hypertension Mother    Cataracts Mother    Arthritis Mother    Lung cancer Father    Colon polyps Sister     Social History   Socioeconomic History   Marital status: Divorced    Spouse name: Not on file   Number of children: Not on file   Years of education: Not on file   Highest education level: Not on file  Occupational History   Not on file  Tobacco Use   Smoking status: Never   Smokeless tobacco: Never  Vaping Use   Vaping Use: Never used  Substance and Sexual Activity   Alcohol use: Not Currently   Drug use: Never   Sexual activity: Not Currently  Other Topics Concern   Not on file  Social History Narrative   Not on file   Social Determinants of Health   Financial Resource Strain: Not on file  Food Insecurity: Not on file  Transportation  Needs: Not on file  Physical Activity: Not on file  Stress: Not on file  Social Connections: Not on file  Intimate Partner Violence: Not on file    Outpatient Medications Prior to Visit  Medication Sig Dispense Refill   Cholecalciferol (VITAMIN D3 PO) Take 1 capsule by mouth daily.     EVENING PRIMROSE OIL PO Take 1 capsule by mouth daily.     No facility-administered medications prior to visit.    Allergies  Allergen Reactions   Amoxicillin Hives    Did it involve swelling of the face/tongue/throat, SOB, or low BP? No Did it involve sudden or severe rash/hives, skin peeling, or any reaction on the inside of your mouth or nose? Yes Did you need to seek medical attention at a hospital or doctor's office? Yes When did it last happen? More than 10 years ago If all above answers are "NO", may proceed with cephalosporin use.    Penicillins Hives    Did it involve swelling of the face/tongue/throat, SOB, or low BP? No Did it involve sudden or severe rash/hives, skin peeling, or any reaction on the inside of your mouth or nose? Yes Did you need to seek medical attention at a hospital or doctor's office? Yes  When did it last happen? More than 10 years ago If all above answers are "NO", may proceed with cephalosporin use.    Review of Systems  Constitutional: Negative.   HENT: Negative.    Eyes: Negative.   Respiratory: Negative.    Cardiovascular: Negative.   Gastrointestinal: Negative.   Skin:  Negative for rash.  Neurological:  Positive for numbness.  All other systems reviewed and are negative.     Objective:    Physical Exam Vitals and nursing note reviewed.  Constitutional:      Appearance: Normal appearance.  HENT:     Head: Normocephalic.     Right Ear: External ear normal.     Nose: Nose normal.  Eyes:     Conjunctiva/sclera: Conjunctivae normal.  Cardiovascular:     Rate and Rhythm: Normal rate and regular rhythm.     Pulses: Normal pulses.     Heart  sounds: Normal heart sounds.  Abdominal:     General: Bowel sounds are normal.  Skin:    Findings: No rash.  Neurological:     Mental Status: She is alert and oriented to person, place, and time.     Comments: Tingling  Psychiatric:        Behavior: Behavior normal.    BP (!) 149/85   Pulse 84   Temp 97.7 F (36.5 C) (Temporal)   Ht 5\' 1"  (1.549 m)   Wt 152 lb 8 oz (69.2 kg)   BMI 28.81 kg/m  Wt Readings from Last 3 Encounters:  03/19/21 152 lb 8 oz (69.2 kg)  05/02/20 158 lb 9.6 oz (71.9 kg)  09/15/19 145 lb (65.8 kg)    Health Maintenance Due  Topic Date Due   COVID-19 Vaccine (1) Never done   HIV Screening  Never done   Hepatitis C Screening  Never done   Zoster Vaccines- Shingrix (1 of 2) Never done   MAMMOGRAM  06/09/2020    There are no preventive care reminders to display for this patient.   No results found for: TSH Lab Results  Component Value Date   WBC 4.3 05/02/2020   HGB 12.1 05/02/2020   HCT 36.6 05/02/2020   MCV 89 05/02/2020   PLT 274 05/02/2020   Lab Results  Component Value Date   NA 137 05/02/2020   K 4.3 05/02/2020   CO2 24 05/02/2020   GLUCOSE 87 05/02/2020   BUN 13 05/02/2020   CREATININE 0.87 05/02/2020   BILITOT 0.3 05/02/2020   ALKPHOS 101 05/02/2020   AST 23 05/02/2020   ALT 18 05/02/2020   PROT 7.6 05/02/2020   ALBUMIN 4.1 05/02/2020   CALCIUM 9.1 05/02/2020   Lab Results  Component Value Date   CHOL 138 05/02/2020   Lab Results  Component Value Date   HDL 50 05/02/2020   Lab Results  Component Value Date   LDLCALC 76 05/02/2020   Lab Results  Component Value Date   TRIG 59 05/02/2020   Lab Results  Component Value Date   CHOLHDL 2.8 05/02/2020   No results found for: HGBA1C     Assessment & Plan:   Problem List Items Addressed This Visit       Nervous and Auditory   Idiopathic peripheral neuropathy - Primary    Slight numbness, tingling and burning sensation right lateral thigh and knee in the  past few weeks.  Symptoms not well controlled.  Prednisone taper Rx sent to pharmacy.  Ibuprofen as needed.  Elevate feet, rest  feet, follow-up with worsening or unresolved symptoms.  Education provided to patient printed handouts given.  Patient verbalized understanding.  Follow-up with worsening unresolved symptoms.      Relevant Medications   predniSONE (STERAPRED UNI-PAK 21 TAB) 10 MG (21) TBPK tablet     Meds ordered this encounter  Medications   predniSONE (STERAPRED UNI-PAK 21 TAB) 10 MG (21) TBPK tablet    Sig: 6 tablet day 1, 5 tablet day 2, 4 tablet day 3, 3 tablet day 4, 2 tablet day 5, 1 tablet day 6    Dispense:  1 each    Refill:  0    Order Specific Question:   Supervising Provider    Answer:   Raliegh Ip [[id number]]     Daryll Drown, NP

## 2021-03-19 NOTE — Assessment & Plan Note (Signed)
Slight numbness, tingling and burning sensation right lateral thigh and knee in the past few weeks.  Symptoms not well controlled.  Prednisone taper Rx sent to pharmacy.  Ibuprofen as needed.  Elevate feet, rest feet, follow-up with worsening or unresolved symptoms.  Education provided to patient printed handouts given.  Patient verbalized understanding.  Follow-up with worsening unresolved symptoms.

## 2021-03-19 NOTE — Patient Instructions (Signed)
Bradley and Daroff's neurology in clinical practice (8th ed., pp. 1853- 1929). Elsevier."> Goldman-Cecil medicine (26th ed., pp. 2489- 2501). Elsevier.">  Peripheral Neuropathy Peripheral neuropathy is a type of nerve damage. It affects nerves that carry signals between the spinal cord and the arms, legs, and the rest of the body (peripheral nerves). It does not affect nerves in the spinal cord or brain. In peripheral neuropathy, one nerve or a group of nerves may be damaged. Peripheral neuropathy is a broad category that includes many specific nerve disorders,like diabetic neuropathy, hereditary neuropathy, and carpal tunnel syndrome. What are the causes? This condition may be caused by: Diabetes. This is the most common cause of peripheral neuropathy. Nerve injury. Pressure or stress on a nerve that lasts a long time. Lack (deficiency) of B vitamins. This can result from alcoholism, poor diet, or a restricted diet. Infections. Autoimmune diseases, such as rheumatoid arthritis and systemic lupus erythematosus. Nerve diseases that are passed from parent to child (inherited). Some medicines, such as cancer medicines (chemotherapy). Poisonous (toxic) substances, such as lead and mercury. Too little blood flowing to the legs. Kidney disease. Thyroid disease. In some cases, the cause of this condition is not known. What are the signs or symptoms? Symptoms of this condition depend on which of your nerves is damaged. Common symptoms include: Loss of feeling (numbness) in the feet, hands, or both. Tingling in the feet, hands, or both. Burning pain. Very sensitive skin. Weakness. Not being able to move a part of the body (paralysis). Muscle twitching. Clumsiness or poor coordination. Loss of balance. Not being able to control your bladder. Feeling dizzy. Sexual problems. How is this diagnosed? Diagnosing and finding the cause of peripheral neuropathy can be difficult. Your health care  provider will take your medical history and do a physical exam. A neurological exam will also be done. This involves checking things that are affected by your brain, spinal cord, and nerves (nervous system). For example, your health care provider will check your reflexes, how youmove, and what you can feel. You may have other tests, such as: Blood tests. Electromyogram (EMG) and nerve conduction tests. These tests check nerve function and how well the nerves are controlling the muscles. Imaging tests, such as CT scans or MRI to rule out other causes of your symptoms. Removing a small piece of nerve to be examined in a lab (nerve biopsy). Removing and examining a small amount of the fluid that surrounds the brain and spinal cord (lumbar puncture). How is this treated? Treatment for this condition may involve: Treating the underlying cause of the neuropathy, such as diabetes, kidney disease, or vitamin deficiencies. Stopping medicines that can cause neuropathy, such as chemotherapy. Medicine to help relieve pain. Medicines may include: Prescription or over-the-counter pain medicine. Antiseizure medicine. Antidepressants. Pain-relieving patches that are applied to painful areas of skin. Surgery to relieve pressure on a nerve or to destroy a nerve that is causing pain. Physical therapy to help improve movement and balance. Devices to help you move around (assistive devices). Follow these instructions at home: Medicines Take over-the-counter and prescription medicines only as told by your health care provider. Do not take any other medicines without first asking your health care provider. Do not drive or use heavy machinery while taking prescription pain medicine. Lifestyle  Do not use any products that contain nicotine or tobacco, such as cigarettes and e-cigarettes. Smoking keeps blood from reaching damaged nerves. If you need help quitting, ask your health care provider. Avoid or limit    alcohol. Too much alcohol can cause a vitamin B deficiency, and vitamin B is needed for healthy nerves. Eat a healthy diet. This includes: Eating foods that are high in fiber, such as fresh fruits and vegetables, whole grains, and beans. Limiting foods that are high in fat and processed sugars, such as fried or sweet foods.  General instructions  If you have diabetes, work closely with your health care provider to keep your blood sugar under control. If you have numbness in your feet: Check every day for signs of injury or infection. Watch for redness, warmth, and swelling. Wear padded socks and comfortable shoes. These help protect your feet. Develop a good support system. Living with peripheral neuropathy can be stressful. Consider talking with a mental health specialist or joining a support group. Use assistive devices and attend physical therapy as told by your health care provider. This may include using a walker or a cane. Keep all follow-up visits as told by your health care provider. This is important.  Contact a health care provider if: You have new signs or symptoms of peripheral neuropathy. You are struggling emotionally from dealing with peripheral neuropathy. Your pain is not well-controlled. Get help right away if: You have an injury or infection that is not healing normally. You develop new weakness in an arm or leg. You have fallen or do so frequently. Summary Peripheral neuropathy is when the nerves in the arms, or legs are damaged, resulting in numbness, weakness, or pain. There are many causes of peripheral neuropathy, including diabetes, pinched nerves, vitamin deficiencies, autoimmune disease, and hereditary conditions. Diagnosing and finding the cause of peripheral neuropathy can be difficult. Your health care provider will take your medical history, do a physical exam, and do tests, including blood tests and nerve function tests. Treatment involves treating the  underlying cause of the neuropathy and taking medicines to help control pain. Physical therapy and assistive devices may also help. This information is not intended to replace advice given to you by your health care provider. Make sure you discuss any questions you have with your healthcare provider. Document Revised: 04/18/2020 Document Reviewed: 04/18/2020 Elsevier Patient Education  2022 [employer]vier Inc[employer]  

## 2021-04-06 ENCOUNTER — Telehealth: Payer: Self-pay | Admitting: Family Medicine

## 2021-04-06 NOTE — Telephone Encounter (Signed)
She will not be seeing me that day, so I cannot answer this.

## 2021-04-06 NOTE — Telephone Encounter (Signed)
Our patient  Last labs x 6 months ago  Acute visit  Will she be getting labs ?

## 2021-04-09 ENCOUNTER — Encounter: Payer: Self-pay | Admitting: Nurse Practitioner

## 2021-04-09 ENCOUNTER — Ambulatory Visit (INDEPENDENT_AMBULATORY_CARE_PROVIDER_SITE_OTHER): Payer: 59 | Admitting: Nurse Practitioner

## 2021-04-09 ENCOUNTER — Other Ambulatory Visit: Payer: Self-pay

## 2021-04-09 VITALS — BP 150/84 | HR 88 | Temp 97.3°F | Ht 61.0 in | Wt 152.0 lb

## 2021-04-09 DIAGNOSIS — G609 Hereditary and idiopathic neuropathy, unspecified: Secondary | ICD-10-CM

## 2021-04-09 DIAGNOSIS — Z23 Encounter for immunization: Secondary | ICD-10-CM

## 2021-04-09 DIAGNOSIS — R030 Elevated blood-pressure reading, without diagnosis of hypertension: Secondary | ICD-10-CM | POA: Diagnosis not present

## 2021-04-09 MED ORDER — GABAPENTIN 300 MG PO CAPS: 300.0000 mg | ORAL_CAPSULE | Freq: Three times a day (TID) | ORAL | 3 refills | Status: DC

## 2021-04-09 NOTE — Assessment & Plan Note (Signed)
Flu shot completed  

## 2021-04-09 NOTE — Patient Instructions (Addendum)
peripheral Neuropathy Peripheral neuropathy is a type of nerve damage. It affects nerves that carry signals between the spinal cord and the arms, legs, and the rest of the body (peripheral nerves). It does not affect nerves in the spinal cord or brain. In peripheral neuropathy, one nerve or a group of nerves may be damaged. Peripheral neuropathy is a broad category that includes many specific nerve disorders, like diabetic neuropathy, hereditary neuropathy, and carpal tunnel syndrome. What are the causes? This condition may be caused by: Diabetes. This is the most common cause of peripheral neuropathy. Nerve injury. Pressure or stress on a nerve that lasts a long time. Lack (deficiency) of B vitamins. This can result from alcoholism, poor diet, or a restricted diet. Infections. Autoimmune diseases, such as rheumatoid arthritis and systemic lupus erythematosus. Nerve diseases that are passed from parent to child (inherited). Some medicines, such as cancer medicines (chemotherapy). Poisonous (toxic) substances, such as lead and mercury. Too little blood flowing to the legs. Kidney disease. Thyroid disease. In some cases, the cause of this condition is not known. What are the signs or symptoms? Symptoms of this condition depend on which of your nerves is damaged. Common symptoms include: Loss of feeling (numbness) in the feet, hands, or both. Tingling in the feet, hands, or both. Burning pain. Very sensitive skin. Weakness. Not being able to move a part of the body (paralysis). Muscle twitching. Clumsiness or poor coordination. Loss of balance. Not being able to control your bladder. Feeling dizzy. Sexual problems. How is this diagnosed? Diagnosing and finding the cause of peripheral neuropathy can be difficult. Your health care provider will take your medical history and do a physical exam. A neurological exam will also be done. This involves checking things that are affected by your  brain, spinal cord, and nerves (nervous system). For example, your health care provider will check your reflexes, how you move, and what you can feel. You may have other tests, such as: Blood tests. Electromyogram (EMG) and nerve conduction tests. These tests check nerve function and how well the nerves are controlling the muscles. Imaging tests, such as CT scans or MRI to rule out other causes of your symptoms. Removing a small piece of nerve to be examined in a lab (nerve biopsy). Removing and examining a small amount of the fluid that surrounds the brain and spinal cord (lumbar puncture). How is this treated? Treatment for this condition may involve: Treating the underlying cause of the neuropathy, such as diabetes, kidney disease, or vitamin deficiencies. Stopping medicines that can cause neuropathy, such as chemotherapy. Medicine to help relieve pain. Medicines may include: Prescription or over-the-counter pain medicine. Antiseizure medicine. Antidepressants. Pain-relieving patches that are applied to painful areas of skin. Surgery to relieve pressure on a nerve or to destroy a nerve that is causing pain. Physical therapy to help improve movement and balance. Devices to help you move around (assistive devices). Follow these instructions at home: Medicines Take over-the-counter and prescription medicines only as told by your health care provider. Do not take any other medicines without first asking your health care provider. Do not drive or use heavy machinery while taking prescription pain medicine. Lifestyle  Do not use any products that contain nicotine or tobacco, such as cigarettes and e-cigarettes. Smoking keeps blood from reaching damaged nerves. If you need help quitting, ask your health care provider. Avoid or limit alcohol. Too much alcohol can cause a vitamin B deficiency, and vitamin B is needed for healthy nerves. Eat a  healthy diet. This includes: Eating foods that are  high in fiber, such as fresh fruits and vegetables, whole grains, and beans. Limiting foods that are high in fat and processed sugars, such as fried or sweet foods. General instructions  If you have diabetes, work closely with your health care provider to keep your blood sugar under control. If you have numbness in your feet: Check every day for signs of injury or infection. Watch for redness, warmth, and swelling. Wear padded socks and comfortable shoes. These help protect your feet. Develop a good support system. Living with peripheral neuropathy can be stressful. Consider talking with a mental health specialist or joining a support group. Use assistive devices and attend physical therapy as told by your health care provider. This may include using a walker or a cane. Keep all follow-up visits as told by your health care provider. This is important. Contact a health care provider if: You have new signs or symptoms of peripheral neuropathy. You are struggling emotionally from dealing with peripheral neuropathy. Your pain is not well-controlled. Get help right away if: You have an injury or infection that is not healing normally. You develop new weakness in an arm or leg. You have fallen or do so frequently. Summary Peripheral neuropathy is when the nerves in the arms, or legs are damaged, resulting in numbness, weakness, or pain. There are many causes of peripheral neuropathy, including diabetes, pinched nerves, vitamin deficiencies, autoimmune disease, and hereditary conditions. Diagnosing and finding the cause of peripheral neuropathy can be difficult. Your health care provider will take your medical history, do a physical exam, and do tests, including blood tests and nerve function tests. Treatment involves treating the underlying cause of the neuropathy and taking medicines to help control pain. Physical therapy and assistive devices may also help. This information is not intended to  replace advice given to you by your health care provider. Make sure you discuss any questions you have with your health care provider. Document Revised: 04/18/2020 Document Reviewed: 04/18/2020 Elsevier Patient Education  2022 [employer].

## 2021-04-09 NOTE — Progress Notes (Signed)
Acute Office Visit  Subjective:    Patient ID: Rachel Brooks, female    DOB: 1961-04-16, 60 y.o.   MRN: [id number]  Chief Complaint  Patient presents with   leg numbness    HPI Patient is in today for Pain  She reports recurrent bilateral lower extremity neuropathic pain. was not an injury that may have caused the pain. The pain started a few months ago and is staying constant. The pain does radiate bilateral upper thigh. The pain is described as throbbing and tingling, is moderate in intensity, occurring constantly. Symptoms are worse in the: mid-day, afternoon, evening  Aggravating factors: none Relieving factors: none.  She has tried oral steroids with little relief.    Concerning patient's elevated blood pressure, this has been ongoing for the past few weeks.  Patient has no blood pressure log.  No signs and symptoms of headache, double vision, or dizziness. Blood pressure in clinic today 169/95, repeat 150/84.   Past Medical History:  Diagnosis Date   Allergy    Anemia, unspecified 06/26/2015   10/1 IMO update    Past Surgical History:  Procedure Laterality Date   CESAREAN SECTION WITH BILATERAL TUBAL LIGATION  1995   COLONOSCOPY N/A 09/15/2019   Procedure: COLONOSCOPY;  Surgeon: Corbin Ade, MD;  Location: [facility];  Service: Endoscopy;  Laterality: N/A;  1:00    Family History  Problem Relation Age of Onset   CAD Mother        95% blockages had cardiac catherization   Hypertension Mother    Cataracts Mother    Arthritis Mother    Lung cancer Father    Colon polyps Sister     Social History   Socioeconomic History   Marital status: Divorced    Spouse name: Not on file   Number of children: Not on file   Years of education: Not on file   Highest education level: Not on file  Occupational History   Not on file  Tobacco Use   Smoking status: Never   Smokeless tobacco: Never  Vaping Use   Vaping Use: Never used  Substance and Sexual Activity    Alcohol use: Not Currently   Drug use: Never   Sexual activity: Not Currently  Other Topics Concern   Not on file  Social History Narrative   Not on file   Social Determinants of Health   Financial Resource Strain: Not on file  Food Insecurity: Not on file  Transportation Needs: Not on file  Physical Activity: Not on file  Stress: Not on file  Social Connections: Not on file  Intimate Partner Violence: Not on file    Outpatient Medications Prior to Visit  Medication Sig Dispense Refill   Cholecalciferol (VITAMIN D3 PO) Take 1 capsule by mouth daily.     predniSONE (STERAPRED UNI-PAK 21 TAB) 10 MG (21) TBPK tablet 6 tablet day 1, 5 tablet day 2, 4 tablet day 3, 3 tablet day 4, 2 tablet day 5, 1 tablet day 6 1 each 0   No facility-administered medications prior to visit.    Allergies  Allergen Reactions   Amoxicillin Hives    Did it involve swelling of the face/tongue/throat, SOB, or low BP? No Did it involve sudden or severe rash/hives, skin peeling, or any reaction on the inside of your mouth or nose? Yes Did you need to seek medical attention at a hospital or doctor's office? Yes When did it last happen? More than 10 years  ago If all above answers are "NO", may proceed with cephalosporin use.    Penicillins Hives    Did it involve swelling of the face/tongue/throat, SOB, or low BP? No Did it involve sudden or severe rash/hives, skin peeling, or any reaction on the inside of your mouth or nose? Yes Did you need to seek medical attention at a hospital or doctor's office? Yes When did it last happen? More than 10 years ago If all above answers are "NO", may proceed with cephalosporin use.    Review of Systems  Constitutional: Negative.   HENT: Negative.    Respiratory: Negative.    Cardiovascular: Negative.   Gastrointestinal: Negative.   Musculoskeletal:  Positive for myalgias.  Skin:  Negative for rash.  Neurological:  Positive for numbness.  All other systems  reviewed and are negative.     Objective:    Physical Exam Vitals and nursing note reviewed.  Constitutional:      Appearance: Normal appearance.  HENT:     Head: Normocephalic.     Nose: Nose normal.  Cardiovascular:     Rate and Rhythm: Normal rate and regular rhythm.     Pulses: Normal pulses.     Heart sounds: Normal heart sounds.  Pulmonary:     Effort: Pulmonary effort is normal.     Breath sounds: Normal breath sounds.  Abdominal:     General: Bowel sounds are normal.  Musculoskeletal:     Right foot: Tenderness present. No swelling.     Left foot: Tenderness present. No swelling.  Skin:    Findings: No rash.  Neurological:     Mental Status: She is alert and oriented to person, place, and time.     Motor: No weakness.  Psychiatric:        Behavior: Behavior normal.    BP (!) 150/84   Pulse 88   Temp (!) 97.3 F (36.3 C) (Temporal)   Ht 5\' 1"  (1.549 m)   Wt 152 lb (68.9 kg)   SpO2 (!) 88%   BMI 28.72 kg/m  Wt Readings from Last 3 Encounters:  04/09/21 152 lb (68.9 kg)  03/19/21 152 lb 8 oz (69.2 kg)  05/02/20 158 lb 9.6 oz (71.9 kg)    Health Maintenance Due  Topic Date Due   COVID-19 Vaccine (1) Never done   HIV Screening  Never done   Hepatitis C Screening  Never done   Zoster Vaccines- Shingrix (1 of 2) Never done   MAMMOGRAM  06/09/2020    There are no preventive care reminders to display for this patient.   No results found for: TSH Lab Results  Component Value Date   WBC 4.3 05/02/2020   HGB 12.1 05/02/2020   HCT 36.6 05/02/2020   MCV 89 05/02/2020   PLT 274 05/02/2020   Lab Results  Component Value Date   NA 137 05/02/2020   K 4.3 05/02/2020   CO2 24 05/02/2020   GLUCOSE 87 05/02/2020   BUN 13 05/02/2020   CREATININE 0.87 05/02/2020   BILITOT 0.3 05/02/2020   ALKPHOS 101 05/02/2020   AST 23 05/02/2020   ALT 18 05/02/2020   PROT 7.6 05/02/2020   ALBUMIN 4.1 05/02/2020   CALCIUM 9.1 05/02/2020   Lab Results  Component  Value Date   CHOL 138 05/02/2020   Lab Results  Component Value Date   HDL 50 05/02/2020   Lab Results  Component Value Date   LDLCALC 76 05/02/2020   Lab Results  Component Value Date   TRIG 59 05/02/2020   Lab Results  Component Value Date   CHOLHDL 2.8 05/02/2020   No results found for: HGBA1C     Assessment & Plan:   Problem List Items Addressed This Visit       Nervous and Auditory   Idiopathic peripheral neuropathy - Primary    Symptoms of bilateral lower extremity neuropathy not well controlled.  Completed prednisone taper 2 weeks ago with no resolution.  Numbness and tingling bilateral lower extremity radiating to upper thigh.  Patient does report working on her feet on the job for the past few years.  I started patient on gabapentin 300 mg by mouth 3 times a day.  Advised patient to get comfortable shoes, try to rest feet as much as possible and follow-up with worsening or unresolved symptoms.  Completed CBC to assess for elevated blood sugar.  Results pending      Relevant Medications   gabapentin (NEURONTIN) 300 MG capsule   Other Relevant Orders   CBC with Differential     Other   Need for immunization against influenza    Flu shot completed      Relevant Orders   Flu Vaccine QUAD 77mo+IM (Fluarix, Fluzone & Alfiuria Quad PF) (Completed)   Elevated blood pressure reading    Elevated blood pressure in the past few weeks.  Advised patient to keep a blood pressure log for the next 1 week, diet modification low-sodium, exercise and weight loss.  Follow-up in 2 to 3 weeks.        Meds ordered this encounter  Medications   gabapentin (NEURONTIN) 300 MG capsule    Sig: Take 1 capsule (300 mg total) by mouth 3 (three) times daily.    Dispense:  90 capsule    Refill:  3    Order Specific Question:   Supervising Provider    Answer:   Raliegh Ip [[id number]]     Daryll Drown, NP

## 2021-04-09 NOTE — Assessment & Plan Note (Signed)
Elevated blood pressure in the past few weeks.  Advised patient to keep a blood pressure log for the next 1 week, diet modification low-sodium, exercise and weight loss.  Follow-up in 2 to 3 weeks.

## 2021-04-09 NOTE — Assessment & Plan Note (Signed)
Symptoms of bilateral lower extremity neuropathy not well controlled.  Completed prednisone taper 2 weeks ago with no resolution.  Numbness and tingling bilateral lower extremity radiating to upper thigh.  Patient does report working on her feet on the job for the past few years.  I started patient on gabapentin 300 mg by mouth 3 times a day.  Advised patient to get comfortable shoes, try to rest feet as much as possible and follow-up with worsening or unresolved symptoms.  Completed CBC to assess for elevated blood sugar.  Results pending

## 2021-04-10 LAB — CBC WITH DIFFERENTIAL/PLATELET
Basophils Absolute: 0 10*3/uL (ref 0.0–0.2)
Basos: 0 %
EOS (ABSOLUTE): 0.1 10*3/uL (ref 0.0–0.4)
Eos: 4 %
Hematocrit: 37.3 % (ref 34.0–46.6)
Hemoglobin: 12.3 g/dL (ref 11.1–15.9)
Immature Grans (Abs): 0 10*3/uL (ref 0.0–0.1)
Immature Granulocytes: 0 %
Lymphocytes Absolute: 1.7 10*3/uL (ref 0.7–3.1)
Lymphs: 49 %
MCH: 29 pg (ref 26.6–33.0)
MCHC: 33 g/dL (ref 31.5–35.7)
MCV: 88 fL (ref 79–97)
Monocytes Absolute: 0.5 10*3/uL (ref 0.1–0.9)
Monocytes: 16 %
Neutrophils Absolute: 1 10*3/uL — ABNORMAL LOW (ref 1.4–7.0)
Neutrophils: 31 %
Platelets: 274 10*3/uL (ref 150–450)
RBC: 4.24 x10E6/uL (ref 3.77–5.28)
RDW: 11.1 % — ABNORMAL LOW (ref 11.7–15.4)
WBC: 3.4 10*3/uL (ref 3.4–10.8)

## 2021-04-16 ENCOUNTER — Telehealth: Payer: Self-pay | Admitting: Family Medicine

## 2021-04-16 NOTE — Telephone Encounter (Signed)
Not tested for DM- left detailed message.

## 2021-04-16 NOTE — Telephone Encounter (Signed)
Pt returned call - please leave message as she is working on floor.

## 2021-04-16 NOTE — Telephone Encounter (Signed)
Called patient, no answer, left message to return call 

## 2021-05-16 ENCOUNTER — Ambulatory Visit
Admission: RE | Admit: 2021-05-16 | Discharge: 2021-05-16 | Disposition: A | Discharge status: Home / Self Care | Payer: 59 | Source: Ambulatory Visit | Attending: Family Medicine | Admitting: Family Medicine

## 2021-05-16 ENCOUNTER — Encounter: Payer: Self-pay | Admitting: Family Medicine

## 2021-05-16 ENCOUNTER — Ambulatory Visit: Payer: 59

## 2021-05-16 ENCOUNTER — Other Ambulatory Visit: Payer: Self-pay

## 2021-05-16 ENCOUNTER — Other Ambulatory Visit (HOSPITAL_COMMUNITY)
Admission: RE | Admit: 2021-05-16 | Discharge: 2021-05-16 | Disposition: A | Discharge status: Home / Self Care | Payer: 59 | Source: Ambulatory Visit | Attending: Family Medicine | Admitting: Family Medicine

## 2021-05-16 ENCOUNTER — Ambulatory Visit (INDEPENDENT_AMBULATORY_CARE_PROVIDER_SITE_OTHER): Payer: 59 | Admitting: Family Medicine

## 2021-05-16 VITALS — BP 137/80 | HR 76 | Temp 97.6°F | Resp 93 | Ht 61.0 in | Wt 155.4 lb

## 2021-05-16 DIAGNOSIS — Z113 Encounter for screening for infections with a predominantly sexual mode of transmission: Secondary | ICD-10-CM | POA: Diagnosis present

## 2021-05-16 DIAGNOSIS — Z124 Encounter for screening for malignant neoplasm of cervix: Secondary | ICD-10-CM

## 2021-05-16 DIAGNOSIS — Z114 Encounter for screening for human immunodeficiency virus [HIV]: Secondary | ICD-10-CM

## 2021-05-16 DIAGNOSIS — Z0001 Encounter for general adult medical examination with abnormal findings: Secondary | ICD-10-CM | POA: Diagnosis not present

## 2021-05-16 DIAGNOSIS — G609 Hereditary and idiopathic neuropathy, unspecified: Secondary | ICD-10-CM

## 2021-05-16 DIAGNOSIS — Z1151 Encounter for screening for human papillomavirus (HPV): Secondary | ICD-10-CM | POA: Diagnosis present

## 2021-05-16 DIAGNOSIS — Z1231 Encounter for screening mammogram for malignant neoplasm of breast: Secondary | ICD-10-CM

## 2021-05-16 DIAGNOSIS — Z0000 Encounter for general adult medical examination without abnormal findings: Secondary | ICD-10-CM

## 2021-05-16 DIAGNOSIS — R7309 Other abnormal glucose: Secondary | ICD-10-CM | POA: Diagnosis not present

## 2021-05-16 DIAGNOSIS — Z1159 Encounter for screening for other viral diseases: Secondary | ICD-10-CM

## 2021-05-16 LAB — BAYER DCA HB A1C WAIVED: HB A1C (BAYER DCA - WAIVED): 5.7 % — ABNORMAL HIGH (ref 4.8–5.6)

## 2021-05-16 NOTE — Progress Notes (Signed)
Assessment & Plan:  1. Well adult exam - preventative wellness information provided  - encouraged to healthy diet and exercise - CMP14+EGFR - Lipid panel - TSH - Anemia Profile B - VITAMIN D 25 Hydroxy (Vit-D Deficiency, Fractures)  2. Idiopathic peripheral neuropathy - Well controlled on current regimen, continue gabapentin as prescribed - CMP14+EGFR - TSH - Anemia Profile B  3. Elevated glucose - Bayer DCA Hb A1c Waived  4. Screening for cervical cancer - Cytology - PAP  5. Routine screening for STI (sexually transmitted infection) - Cytology - PAP  6. Screening for human papillomavirus (HPV) - Cytology - PAP  7. Encounter for hepatitis C screening test for low risk patient - Hepatitis C antibody (reflex, frozen specimen)  8. Encounter for screening for HIV - HIV antibody (with reflex)   Follow-up: Return in about 1 year (around 05/16/2022) for annual physical.   Lucile Crater, NP Student  I personally was present during the history, physical exam, and medical decision-making activities of this service and have verified that the service and findings are accurately documented in the nurse practitioner student's note.  Hendricks Limes, MSN, APRN, FNP-C [facility]   Subjective:  Patient ID: Rachel Brooks, female    DOB: May 17, 1961  Age: 60 y.o. MRN: [id number]  Patient Care Team: Loman Brooklyn, FNP as PCP - General (Family Medicine) Nat Math, MD as Referring Physician (Obstetrics and Gynecology) Daneil Dolin, MD as Consulting Physician (Gastroenterology)   CC:  Chief Complaint  Patient presents with   Annual Exam    HPI Rachel Brooks presents for annual physical with gynecological exam.  She was recently seen in September for progressively worsening bilateral lower extremity neuropathy pain that failed to respond to prednisone taper and was initiated on gabapentin 300 mg TID. She states this is helping a lot with her  pain.   She checks her blood pressure at home and gets readings 130's-140's/80s.   She is interested in checking labs and being evaluated for diabetes.   Health Maintenance: Occupation: employed, Marital status: single, Substance use: none Diet: regular, tries to use less salt, Exercise: some walking Last eye exam: Dr. Marin Comment, 2022 Last dental exam: [place], 2022 Last colonoscopy: 08/2019 Last mammogram: scheduled for today Last pap smear: due today Hepatitis C Screening: never done, will do today Immunizations: Flu Vaccine: up to date Tdap Vaccine: up to date  Shingrix Vaccine: at the pharmacy for administration, so it can be run through insurance first   COVID-19 Vaccine: up to date  DEPRESSION SCREENING Depression screen [facility] 2/9 05/16/2021 04/09/2021 03/19/2021  Decreased Interest 0 0 0  Down, Depressed, Hopeless 0 0 0  PHQ - 2 Score 0 0 0  Altered sleeping 0 - 0  Tired, decreased energy 0 - 0  Change in appetite 0 - 0  Feeling bad or failure about yourself  0 - 0  Trouble concentrating 0 - 0  Moving slowly or fidgety/restless 0 - 0  Suicidal thoughts 0 - 0  PHQ-9 Score 0 - 0  Difficult doing work/chores Not difficult at all - Not difficult at all   GAD 7 : Generalized Anxiety Score 05/16/2021 03/19/2021  Nervous, Anxious, on Edge 0 0  Control/stop worrying 0 0  Worry too much - different things 0 0  Trouble relaxing 0 0  Restless 0 0  Easily annoyed or irritable 0 0  Afraid - awful might happen 0 0  Total GAD 7 Score 0 0  Anxiety Difficulty Not difficult at all Not difficult at all    Review of Systems  Constitutional: Negative.  Negative for chills, fever, malaise/fatigue and weight loss.  HENT: Negative.  Negative for congestion, ear discharge, ear pain, hearing loss and sinus pain.   Eyes: Negative.  Negative for blurred vision, pain, discharge and redness.  Respiratory: Negative.  Negative for cough, shortness of breath and wheezing.   Cardiovascular:  Negative.  Negative for chest pain, palpitations, orthopnea and leg swelling.  Gastrointestinal: Negative.  Negative for abdominal pain, constipation, diarrhea, heartburn, nausea and vomiting.  Genitourinary: Negative.  Negative for dysuria, frequency and urgency.  Musculoskeletal: Negative.  Negative for back pain, falls, joint pain, myalgias and neck pain.  Skin: Negative.  Negative for itching and rash.  Neurological: Negative.  Negative for dizziness, tremors, weakness and headaches.  Endo/Heme/Allergies:  Positive for environmental allergies (seasonal allergies, not at this time). Negative for polydipsia. Does not bruise/bleed easily.  Psychiatric/Behavioral: Negative.  Negative for depression, memory loss and substance abuse. The patient is not nervous/anxious and does not have insomnia.   All other systems reviewed and are negative.   Current Outpatient Medications:    gabapentin (NEURONTIN) 300 MG capsule, Take 1 capsule (300 mg total) by mouth 3 (three) times daily., Disp: 90 capsule, Rfl: 3  Allergies  Allergen Reactions   Amoxicillin Hives    Did it involve swelling of the face/tongue/throat, SOB, or low BP? No Did it involve sudden or severe rash/hives, skin peeling, or any reaction on the inside of your mouth or nose? Yes Did you need to seek medical attention at a hospital or doctor's office? Yes When did it last happen? More than 10 years ago If all above answers are "NO", may proceed with cephalosporin use.    Penicillins Hives    Did it involve swelling of the face/tongue/throat, SOB, or low BP? No Did it involve sudden or severe rash/hives, skin peeling, or any reaction on the inside of your mouth or nose? Yes Did you need to seek medical attention at a hospital or doctor's office? Yes When did it last happen? More than 10 years ago If all above answers are "NO", may proceed with cephalosporin use.    Past Medical History:  Diagnosis Date   Allergy    Anemia,  unspecified 06/26/2015   10/1 IMO update    Past Surgical History:  Procedure Laterality Date   CESAREAN SECTION WITH BILATERAL TUBAL LIGATION  1995   COLONOSCOPY N/A 09/15/2019   Procedure: COLONOSCOPY;  Surgeon: Daneil Dolin, MD;  [facility];  Service: Endoscopy;  Laterality: N/A;  1:00    Family History  Problem Relation Age of Onset   CAD Mother        95% blockages had cardiac catherization   Hypertension Mother    Cataracts Mother    Arthritis Mother    Lung cancer Father    Colon polyps Sister     Social History   Socioeconomic History   Marital status: Divorced    Spouse name: Not on file   Number of children: Not on file   Years of education: Not on file   Highest education level: Not on file  Occupational History   Not on file  Tobacco Use   Smoking status: Never   Smokeless tobacco: Never  Vaping Use   Vaping Use: Never used  Substance and Sexual Activity   Alcohol use: Not Currently   Drug use: Never  Sexual activity: Not Currently  Other Topics Concern   Not on file  Social History Narrative   Not on file   Social Determinants of Health   Financial Resource Strain: Not on file  Food Insecurity: Not on file  Transportation Needs: Not on file  Physical Activity: Not on file  Stress: Not on file  Social Connections: Not on file  Intimate Partner Violence: Not on file      Objective:    BP 137/80   Pulse 76   Temp 97.6 F (36.4 C)   Resp (!) 93   Ht 5' 1"  (1.549 m)   Wt 70.5 kg   BMI 29.36 kg/m   Wt Readings from Last 3 Encounters:  05/16/21 155 lb 6.4 oz (70.5 kg)  04/09/21 152 lb (68.9 kg)  03/19/21 152 lb 8 oz (69.2 kg)    Physical Exam Vitals reviewed. Exam conducted with a chaperone present.  Constitutional:      General: She is not in acute distress.    Appearance: Normal appearance. She is overweight. She is not ill-appearing, toxic-appearing or diaphoretic.  HENT:     Head: Normocephalic and atraumatic.      Right Ear: Tympanic membrane, ear canal and external ear normal.     Left Ear: Tympanic membrane, ear canal and external ear normal.     Nose: Nose normal. No congestion.     Mouth/Throat:     Mouth: Mucous membranes are moist.     Pharynx: Oropharynx is clear. No oropharyngeal exudate or posterior oropharyngeal erythema.  Eyes:     Extraocular Movements: Extraocular movements intact.     Conjunctiva/sclera: Conjunctivae normal.     Pupils: Pupils are equal, round, and reactive to light.  Cardiovascular:     Rate and Rhythm: Normal rate and regular rhythm.     Pulses: Normal pulses.     Heart sounds: Normal heart sounds.  Pulmonary:     Effort: Pulmonary effort is normal. No respiratory distress.     Breath sounds: Normal breath sounds. No wheezing or rhonchi.  Abdominal:     General: Bowel sounds are normal. There is no distension.     Palpations: Abdomen is soft. There is no mass.  Genitourinary:    Vagina: Normal.     Cervix: Discharge (scant white discharge) present.     Uterus: Deviated.      Adnexa: Right adnexa normal and left adnexa normal.  Musculoskeletal:        General: Normal range of motion.     Cervical back: Normal range of motion.  Skin:    General: Skin is warm and dry.  Neurological:     General: No focal deficit present.     Mental Status: She is alert and oriented to person, place, and time.     Motor: No weakness.     Gait: Gait normal.  Psychiatric:        Mood and Affect: Mood normal.        Behavior: Behavior normal.        Thought Content: Thought content normal.        Judgment: Judgment normal.    No results found for: TSH Lab Results  Component Value Date   WBC 3.4 04/09/2021   HGB 12.3 04/09/2021   HCT 37.3 04/09/2021   MCV 88 04/09/2021   PLT 274 04/09/2021   Lab Results  Component Value Date   NA 137 05/02/2020   K 4.3 05/02/2020   CO2 24  05/02/2020   GLUCOSE 87 05/02/2020   BUN 13 05/02/2020   CREATININE 0.87 05/02/2020    BILITOT 0.3 05/02/2020   ALKPHOS 101 05/02/2020   AST 23 05/02/2020   ALT 18 05/02/2020   PROT 7.6 05/02/2020   ALBUMIN 4.1 05/02/2020   CALCIUM 9.1 05/02/2020   Lab Results  Component Value Date   CHOL 138 05/02/2020   Lab Results  Component Value Date   HDL 50 05/02/2020   Lab Results  Component Value Date   LDLCALC 76 05/02/2020   Lab Results  Component Value Date   TRIG 59 05/02/2020   Lab Results  Component Value Date   CHOLHDL 2.8 05/02/2020   No results found for: HGBA1C

## 2021-05-17 ENCOUNTER — Encounter: Payer: Self-pay | Admitting: Family Medicine

## 2021-05-17 DIAGNOSIS — R7303 Prediabetes: Secondary | ICD-10-CM | POA: Insufficient documentation

## 2021-05-17 HISTORY — DX: Prediabetes: R73.03

## 2021-05-17 LAB — LIPID PANEL
Chol/HDL Ratio: 2.7 ratio (ref 0.0–4.4)
Cholesterol, Total: 136 mg/dL (ref 100–199)
HDL: 50 mg/dL (ref >39)
LDL Chol Calc (NIH): 73 mg/dL (ref 0–99)
Triglycerides: 60 mg/dL (ref 0–149)
VLDL Cholesterol Cal: 13 mg/dL (ref 5–40)

## 2021-05-17 LAB — ANEMIA PROFILE B
Basophils Absolute: 0 10*3/uL (ref 0.0–0.2)
Basos: 1 %
EOS (ABSOLUTE): 0.1 10*3/uL (ref 0.0–0.4)
Eos: 3 %
Ferritin: 413 ng/mL — ABNORMAL HIGH (ref 15–150)
Folate: 15 ng/mL (ref >3.0)
Hematocrit: 38.8 % (ref 34.0–46.6)
Hemoglobin: 12.8 g/dL (ref 11.1–15.9)
Immature Grans (Abs): 0 10*3/uL (ref 0.0–0.1)
Immature Granulocytes: 0 %
Iron Saturation: 30 % (ref 15–55)
Iron: 74 ug/dL (ref 27–139)
Lymphocytes Absolute: 1.8 10*3/uL (ref 0.7–3.1)
Lymphs: 50 %
MCH: 29.2 pg (ref 26.6–33.0)
MCHC: 33 g/dL (ref 31.5–35.7)
MCV: 89 fL (ref 79–97)
Monocytes Absolute: 0.5 10*3/uL (ref 0.1–0.9)
Monocytes: 15 %
Neutrophils Absolute: 1.1 10*3/uL — ABNORMAL LOW (ref 1.4–7.0)
Neutrophils: 31 %
Platelets: 265 10*3/uL (ref 150–450)
RBC: 4.38 x10E6/uL (ref 3.77–5.28)
RDW: 11.8 % (ref 11.7–15.4)
Retic Ct Pct: 1.2 % (ref 0.6–2.6)
Total Iron Binding Capacity: 249 ug/dL — ABNORMAL LOW (ref 250–450)
UIBC: 175 ug/dL (ref 118–369)
Vitamin B-12: 604 pg/mL (ref 232–1245)
WBC: 3.6 10*3/uL (ref 3.4–10.8)

## 2021-05-17 LAB — HCV INTERPRETATION

## 2021-05-17 LAB — CMP14+EGFR
ALT: 20 IU/L (ref 0–32)
AST: 28 IU/L (ref 0–40)
Albumin/Globulin Ratio: 1.3 (ref 1.2–2.2)
Albumin: 4.3 g/dL (ref 3.8–4.8)
Alkaline Phosphatase: 92 IU/L (ref 44–121)
BUN/Creatinine Ratio: 14 (ref 12–28)
BUN: 12 mg/dL (ref 8–27)
Bilirubin Total: 0.3 mg/dL (ref 0.0–1.2)
CO2: 23 mmol/L (ref 20–29)
Calcium: 9.4 mg/dL (ref 8.7–10.3)
Chloride: 104 mmol/L (ref 96–106)
Creatinine, Ser: 0.83 mg/dL (ref 0.57–1.00)
Globulin, Total: 3.2 g/dL (ref 1.5–4.5)
Glucose: 88 mg/dL (ref 70–99)
Potassium: 4.7 mmol/L (ref 3.5–5.2)
Sodium: 141 mmol/L (ref 134–144)
Total Protein: 7.5 g/dL (ref 6.0–8.5)
eGFR: 80 mL/min/{1.73_m2} (ref >59)

## 2021-05-17 LAB — VITAMIN D 25 HYDROXY (VIT D DEFICIENCY, FRACTURES): Vit D, 25-Hydroxy: 41.3 ng/mL (ref 30.0–100.0)

## 2021-05-17 LAB — CYTOLOGY - PAP
Chlamydia: NEGATIVE
Comment: NEGATIVE
Comment: NEGATIVE
Comment: NEGATIVE
Comment: NORMAL
Diagnosis: NEGATIVE
High risk HPV: NEGATIVE
Neisseria Gonorrhea: NEGATIVE
Trichomonas: NEGATIVE

## 2021-05-17 LAB — HCV AB W REFLEX TO QUANT PCR: HCV Ab: <0.1 s/co ratio (ref 0.0–0.9)

## 2021-05-17 LAB — HIV ANTIBODY (ROUTINE TESTING W REFLEX): HIV Screen 4th Generation wRfx: NONREACTIVE

## 2021-05-17 LAB — TSH: TSH: 1.39 u[IU]/mL (ref 0.450–4.500)

## 2021-05-18 ENCOUNTER — Encounter: Payer: Self-pay | Admitting: Family Medicine

## 2021-07-27 ENCOUNTER — Other Ambulatory Visit: Payer: Self-pay | Admitting: Nurse Practitioner

## 2021-07-27 DIAGNOSIS — G609 Hereditary and idiopathic neuropathy, unspecified: Secondary | ICD-10-CM

## 2022-05-23 ENCOUNTER — Other Ambulatory Visit: Payer: Self-pay | Admitting: Nurse Practitioner

## 2022-05-23 DIAGNOSIS — Z1231 Encounter for screening mammogram for malignant neoplasm of breast: Secondary | ICD-10-CM

## 2022-06-17 ENCOUNTER — Ambulatory Visit (INDEPENDENT_AMBULATORY_CARE_PROVIDER_SITE_OTHER): Payer: 59 | Admitting: Nurse Practitioner

## 2022-06-17 ENCOUNTER — Ambulatory Visit
Admission: RE | Admit: 2022-06-17 | Discharge: 2022-06-17 | Disposition: A | Discharge status: Home / Self Care | Payer: 59 | Source: Ambulatory Visit | Attending: Nurse Practitioner | Admitting: Nurse Practitioner

## 2022-06-17 ENCOUNTER — Encounter: Payer: Self-pay | Admitting: Nurse Practitioner

## 2022-06-17 VITALS — BP 138/87 | HR 99 | Temp 98.7°F | Ht 61.0 in | Wt 157.6 lb

## 2022-06-17 DIAGNOSIS — Z1231 Encounter for screening mammogram for malignant neoplasm of breast: Secondary | ICD-10-CM | POA: Diagnosis not present

## 2022-06-17 DIAGNOSIS — Z0000 Encounter for general adult medical examination without abnormal findings: Secondary | ICD-10-CM | POA: Diagnosis not present

## 2022-06-17 NOTE — Patient Instructions (Signed)

## 2022-06-17 NOTE — Progress Notes (Signed)
Established Patient Office Visit  Subjective   Patient ID: Rachel Brooks, female    DOB: Oct 21, 1960  Age: 61 y.o. MRN: [id number]  Chief Complaint  Patient presents with   Annual Exam    HPI Encounter for general adult medical examination  Physical: Patient's last physical exam was 1 year ago .  Weight: Appropriate for height (BMI greater than 27%) ;  Blood Pressure: Normal (BP less than 120/80) ;  Medical History: Patient history reviewed ; Family history reviewed  Allergies Reviewed: No change in current allergies ;  Medications Reviewed: Medications reviewed - no changes ;  Lipids: Normal lipid levels ; Labs completed results pending Smoking: Life-long non-smoker  Physical Activity: Exercises at least 3 times per week as tolerated Alcohol/Drug Use: Is a non-drinker ; No illicit drug use ;  Patient is not afflicted from Stress Incontinence and Urge Incontinence  Safety: reviewed ; Patient wears a seat belt, has smoke detectors, has carbon monoxide detectors, and wears sunscreen with extended sun exposure. Dental Care: biannual cleanings, brushes and flosses daily. Ophthalmology/Optometry: Annual visit.  Hearing loss: none Vision impairments: none: wears readers  Patient Active Problem List   Diagnosis Date Noted   Encounter for annual physical exam 06/17/2022   Prediabetes 05/17/2021   Idiopathic peripheral neuropathy 03/19/2021   Post-menopausal 06/26/2015   Past Medical History:  Diagnosis Date   Allergy    Anemia, unspecified 06/26/2015   10/1 IMO update   Prediabetes 05/17/2021   Past Surgical History:  Procedure Laterality Date   CESAREAN SECTION WITH BILATERAL TUBAL LIGATION  1995   COLONOSCOPY N/A 09/15/2019   Procedure: COLONOSCOPY;  Surgeon: Daneil Dolin, MD;  [facility];  Service: Endoscopy;  Laterality: N/A;  1:00   Social History   Tobacco Use   Smoking status: Never   Smokeless tobacco: Never  Vaping Use   Vaping Use: Never used   Substance Use Topics   Alcohol use: Not Currently   Drug use: Never   Social History   Socioeconomic History   Marital status: Divorced    Spouse name: Not on file   Number of children: Not on file   Years of education: Not on file   Highest education level: Not on file  Occupational History   Not on file  Tobacco Use   Smoking status: Never   Smokeless tobacco: Never  Vaping Use   Vaping Use: Never used  Substance and Sexual Activity   Alcohol use: Not Currently   Drug use: Never   Sexual activity: Not Currently  Other Topics Concern   Not on file  Social History Narrative   Not on file   Social Determinants of Health   Financial Resource Strain: Not on file  Food Insecurity: Not on file  Transportation Needs: Not on file  Physical Activity: Not on file  Stress: Not on file  Social Connections: Not on file  Intimate Partner Violence: Not on file   Family Status  Relation Name Status   Mother  Alive   Father  Deceased at age 67   Sister  Alive   Sister  Alive   Sister  [place]   Sister  [place]   Daughter  [place]  Deceased   MGF  Deceased   [place]  Deceased   PGF  Deceased   Brother  Alive   Brother  Alive   Brother  Alive   Neg Hx  (Not Specified)   Family History  Problem Relation Age of Onset   CAD Mother        95% blockages had cardiac catherization   Hypertension Mother    Cataracts Mother    Arthritis Mother    Lung cancer Father    Colon polyps Sister    Breast cancer Neg Hx    Allergies  Allergen Reactions   Amoxicillin Hives    Did it involve swelling of the face/tongue/throat, SOB, or low BP? No Did it involve sudden or severe rash/hives, skin peeling, or any reaction on the inside of your mouth or nose? Yes Did you need to seek medical attention at a hospital or doctor's office? Yes When did it last happen? More than 10 years ago If all above answers are "NO", may proceed with cephalosporin use.    Penicillins Hives    Did it  involve swelling of the face/tongue/throat, SOB, or low BP? No Did it involve sudden or severe rash/hives, skin peeling, or any reaction on the inside of your mouth or nose? Yes Did you need to seek medical attention at a hospital or doctor's office? Yes When did it last happen? More than 10 years ago If all above answers are "NO", may proceed with cephalosporin use.      Review of Systems  Constitutional: Negative.   HENT: Negative.    Eyes: Negative.   Respiratory: Negative.    Cardiovascular: Negative.   Gastrointestinal:  Negative for heartburn.  Genitourinary: Negative.   Musculoskeletal: Negative.   Skin: Negative.  Negative for itching and rash.  Neurological: Negative.   Psychiatric/Behavioral: Negative.    All other systems reviewed and are negative.     Objective:     BP 138/87   Pulse 99   Temp 98.7 F (37.1 C)   Ht 5' 1" (1.549 m)   Wt 157 lb 9.6 oz (71.5 kg)   SpO2 96%   BMI 29.78 kg/m  BP Readings from Last 3 Encounters:  06/17/22 138/87  05/16/21 137/80  04/09/21 (!) 150/84   Wt Readings from Last 3 Encounters:  06/17/22 157 lb 9.6 oz (71.5 kg)  05/16/21 155 lb 6.4 oz (70.5 kg)  04/09/21 152 lb (68.9 kg)      Physical Exam Vitals and nursing note reviewed.  Constitutional:      Appearance: Normal appearance.  HENT:     Head: Normocephalic.     Right Ear: External ear normal. There is no impacted cerumen.     Left Ear: External ear normal. There is no impacted cerumen.     Nose: Nose normal. No congestion.     Mouth/Throat:     Mouth: Mucous membranes are moist.     Pharynx: Oropharynx is clear.  Eyes:     Conjunctiva/sclera: Conjunctivae normal.     Pupils: Pupils are equal, round, and reactive to light.  Cardiovascular:     Rate and Rhythm: Normal rate and regular rhythm.     Pulses: Normal pulses.     Heart sounds: Normal heart sounds.  Pulmonary:     Effort: Pulmonary effort is normal.     Breath sounds: Normal breath sounds.   Abdominal:     General: Bowel sounds are normal.  Musculoskeletal:        General: Normal range of motion.  Skin:    General: Skin is warm.     Findings: No erythema or rash.  Neurological:     General: No focal deficit present.     Mental Status: She  is alert and oriented to person, place, and time.  Psychiatric:        Mood and Affect: Mood normal.        Behavior: Behavior normal.      No results found for any visits on 06/17/22.  Last CBC Lab Results  Component Value Date   WBC 3.6 05/16/2021   HGB 12.8 05/16/2021   HCT 38.8 05/16/2021   MCV 89 05/16/2021   MCH 29.2 05/16/2021   RDW 11.8 05/16/2021   PLT 265 11/94/1740   Last metabolic panel Lab Results  Component Value Date   GLUCOSE 88 05/16/2021   NA 141 05/16/2021   K 4.7 05/16/2021   CL 104 05/16/2021   CO2 23 05/16/2021   BUN 12 05/16/2021   CREATININE 0.83 05/16/2021   EGFR 80 05/16/2021   CALCIUM 9.4 05/16/2021   PROT 7.5 05/16/2021   ALBUMIN 4.3 05/16/2021   LABGLOB 3.2 05/16/2021   AGRATIO 1.3 05/16/2021   BILITOT 0.3 05/16/2021   ALKPHOS 92 05/16/2021   AST 28 05/16/2021   ALT 20 05/16/2021   Last lipids Lab Results  Component Value Date   CHOL 136 05/16/2021   HDL 50 05/16/2021   LDLCALC 73 05/16/2021   TRIG 60 05/16/2021   CHOLHDL 2.7 05/16/2021   Last hemoglobin A1c Lab Results  Component Value Date   HGBA1C 5.7 (H) 05/16/2021   Last thyroid functions Lab Results  Component Value Date   TSH 1.390 05/16/2021   Last vitamin D Lab Results  Component Value Date   VD25OH 41.3 05/16/2021   Last vitamin B12 and Folate Lab Results  Component Value Date   CXKGYJEH63 149 05/16/2021   FOLATE 15.0 05/16/2021      The 10-year ASCVD risk score (Arnett DK, et al., 2019) is: 5.5%    Assessment & Plan:   Problem List Items Addressed This Visit       Other   Encounter for annual physical exam - Primary    Completed annual physical exam.  Patient has no new concerns.   Education provided to patient on preventative care and health maintenance.  Labs completed today CBC, CMP, lipid panel.  Mammograms completed today results pending.  Return in 1 year.        Relevant Orders   CBC with Differential/Platelet   CMP14+EGFR   HCV Ab w Reflex to Quant PCR   TSH   Lipid panel   Bayer DCA Hb A1c Waived    Return in about 1 year (around 06/18/2023) for Annual physical exam.    Ivy Lynn, NP

## 2022-06-17 NOTE — Assessment & Plan Note (Signed)
Completed annual physical exam.  Patient has no new concerns.  Education provided to patient on preventative care and health maintenance.  Labs completed today CBC, CMP, lipid panel.  Mammograms completed today results pending.  Return in 1 year.

## 2022-06-20 ENCOUNTER — Other Ambulatory Visit: Payer: 59

## 2022-06-20 DIAGNOSIS — Z0000 Encounter for general adult medical examination without abnormal findings: Secondary | ICD-10-CM

## 2022-06-20 LAB — BAYER DCA HB A1C WAIVED: HB A1C (BAYER DCA - WAIVED): 5.9 % — ABNORMAL HIGH (ref 4.8–5.6)

## 2022-06-21 LAB — CBC WITH DIFFERENTIAL/PLATELET
Basophils Absolute: 0 10*3/uL (ref 0.0–0.2)
Basos: 1 %
EOS (ABSOLUTE): 0.2 10*3/uL (ref 0.0–0.4)
Eos: 5 %
Hematocrit: 39.1 % (ref 34.0–46.6)
Hemoglobin: 12.9 g/dL (ref 11.1–15.9)
Immature Grans (Abs): 0 10*3/uL (ref 0.0–0.1)
Immature Granulocytes: 0 %
Lymphocytes Absolute: 1.6 10*3/uL (ref 0.7–3.1)
Lymphs: 49 %
MCH: 29.1 pg (ref 26.6–33.0)
MCHC: 33 g/dL (ref 31.5–35.7)
MCV: 88 fL (ref 79–97)
Monocytes Absolute: 0.5 10*3/uL (ref 0.1–0.9)
Monocytes: 16 %
Neutrophils Absolute: 1 10*3/uL — ABNORMAL LOW (ref 1.4–7.0)
Neutrophils: 29 %
Platelets: 273 10*3/uL (ref 150–450)
RBC: 4.43 x10E6/uL (ref 3.77–5.28)
RDW: 11.4 % — ABNORMAL LOW (ref 11.7–15.4)
WBC: 3.3 10*3/uL — ABNORMAL LOW (ref 3.4–10.8)

## 2022-06-21 LAB — HCV AB W REFLEX TO QUANT PCR: HCV Ab: NONREACTIVE

## 2022-06-21 LAB — CMP14+EGFR
ALT: 15 IU/L (ref 0–32)
AST: 21 IU/L (ref 0–40)
Albumin/Globulin Ratio: 1.3 (ref 1.2–2.2)
Albumin: 4.2 g/dL (ref 3.9–4.9)
Alkaline Phosphatase: 96 IU/L (ref 44–121)
BUN/Creatinine Ratio: 12 (ref 12–28)
BUN: 11 mg/dL (ref 8–27)
Bilirubin Total: 0.4 mg/dL (ref 0.0–1.2)
CO2: 25 mmol/L (ref 20–29)
Calcium: 9.3 mg/dL (ref 8.7–10.3)
Chloride: 104 mmol/L (ref 96–106)
Creatinine, Ser: 0.92 mg/dL (ref 0.57–1.00)
Globulin, Total: 3.3 g/dL (ref 1.5–4.5)
Glucose: 99 mg/dL (ref 70–99)
Potassium: 4.4 mmol/L (ref 3.5–5.2)
Sodium: 140 mmol/L (ref 134–144)
Total Protein: 7.5 g/dL (ref 6.0–8.5)
eGFR: 70 mL/min/{1.73_m2} (ref >59)

## 2022-06-21 LAB — TSH: TSH: 2.19 u[IU]/mL (ref 0.450–4.500)

## 2022-06-21 LAB — LIPID PANEL
Chol/HDL Ratio: 2.6 ratio (ref 0.0–4.4)
Cholesterol, Total: 153 mg/dL (ref 100–199)
HDL: 58 mg/dL (ref >39)
LDL Chol Calc (NIH): 82 mg/dL (ref 0–99)
Triglycerides: 63 mg/dL (ref 0–149)
VLDL Cholesterol Cal: 13 mg/dL (ref 5–40)

## 2022-06-21 LAB — HCV INTERPRETATION

## 2022-06-28 ENCOUNTER — Other Ambulatory Visit: Payer: Self-pay | Admitting: Family Medicine

## 2022-06-28 DIAGNOSIS — G609 Hereditary and idiopathic neuropathy, unspecified: Secondary | ICD-10-CM

## 2022-08-06 ENCOUNTER — Encounter: Payer: Self-pay | Admitting: *Deleted

## 2022-09-25 IMAGING — MG MM DIGITAL SCREENING BILAT W/ TOMO AND CAD
8 series · 9 of 24 positions shown · non-contrast
Comparison: Previous exam(s).

CLINICAL DATA: Screening.

EXAM:
DIGITAL SCREENING BILATERAL MAMMOGRAM WITH TOMOSYNTHESIS AND CAD
TECHNIQUE: Bilateral screening digital craniocaudal and mediolateral oblique
mammograms were obtained. Bilateral screening digital breast
tomosynthesis was performed. The images were evaluated with
computer-aided detection.

[L CC synth-2D]
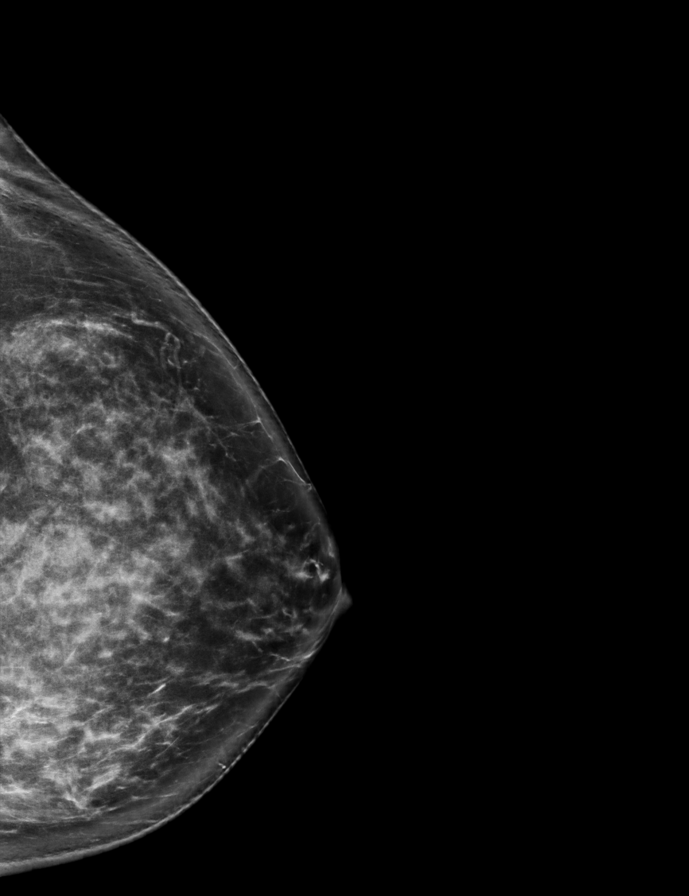

[L MLO synth-2D]
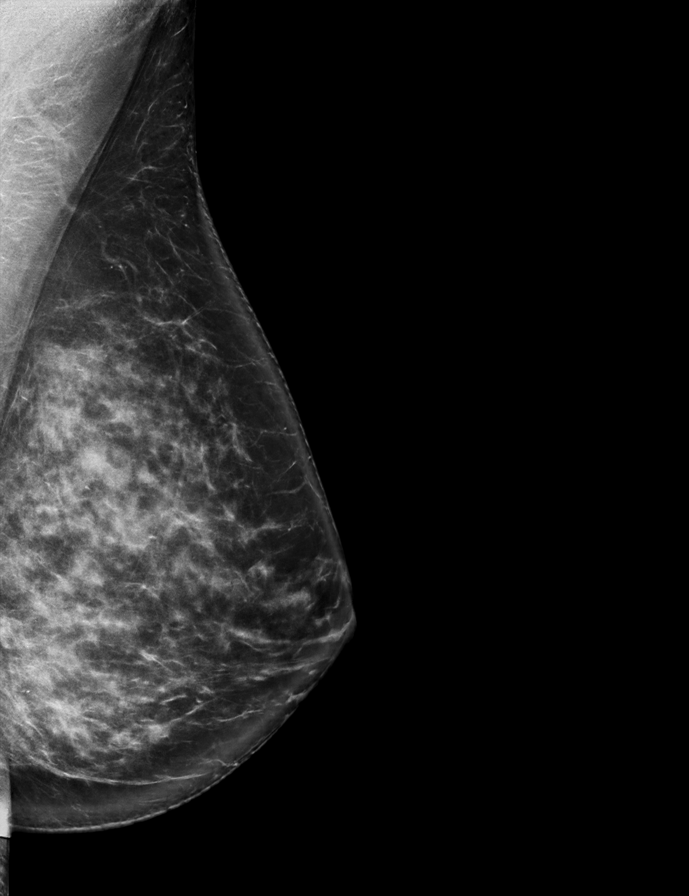

[R CC synth-2D]
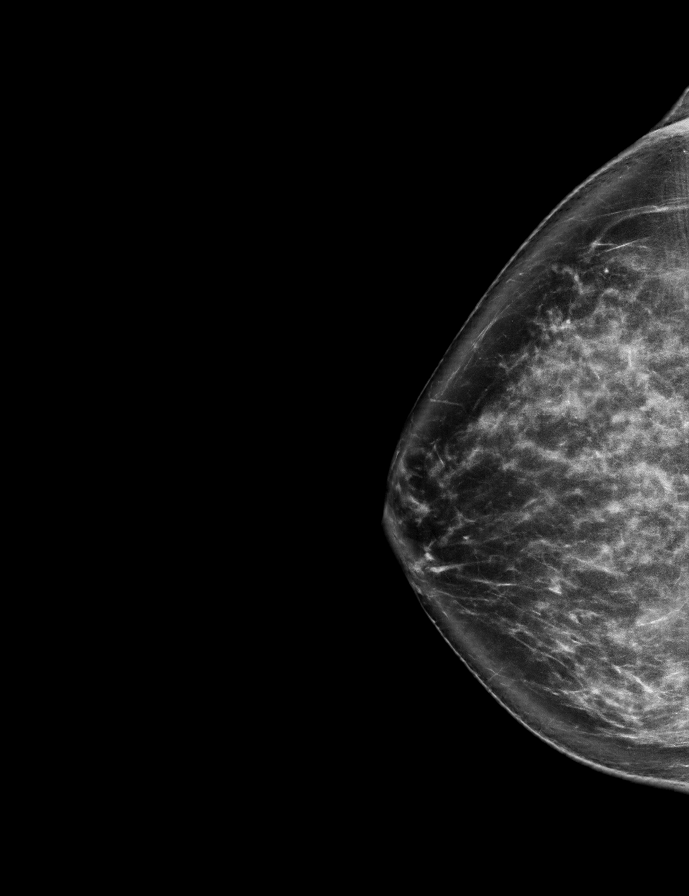

[R MLO synth-2D]
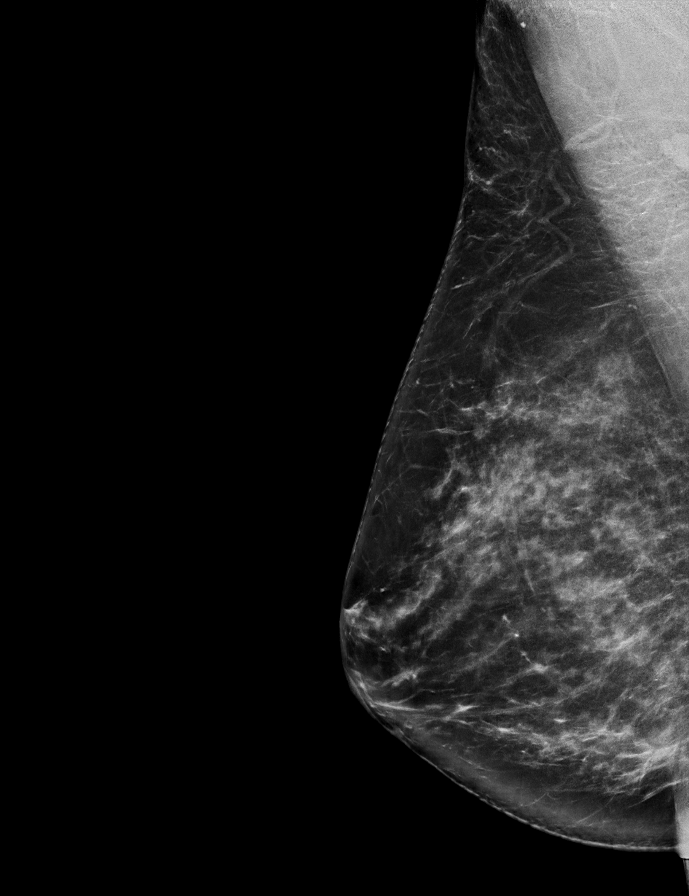

[R MLO tomo · 2 of 78 frames shown]
[frame 26/78]
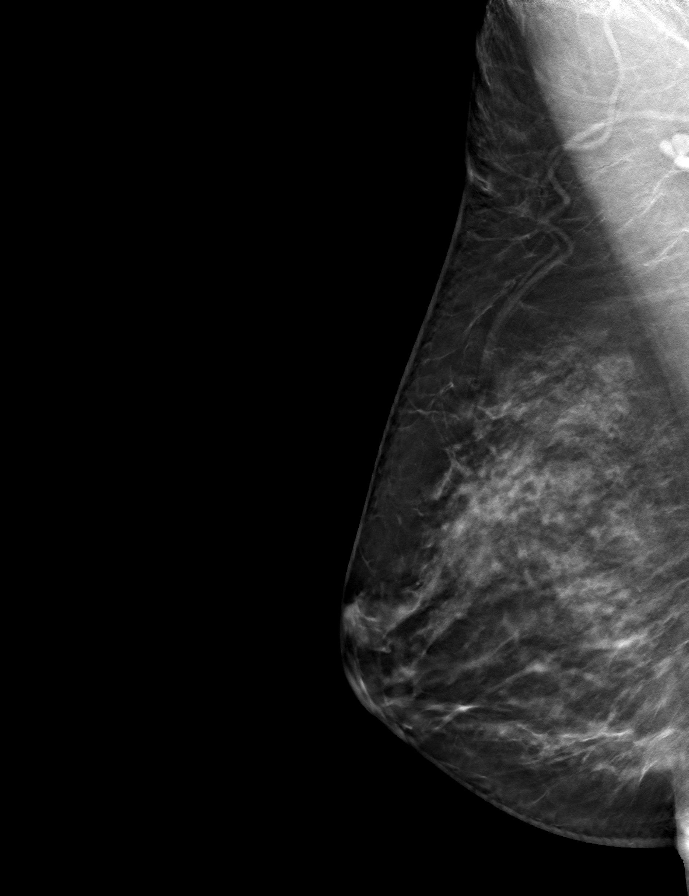
[frame 39/78]
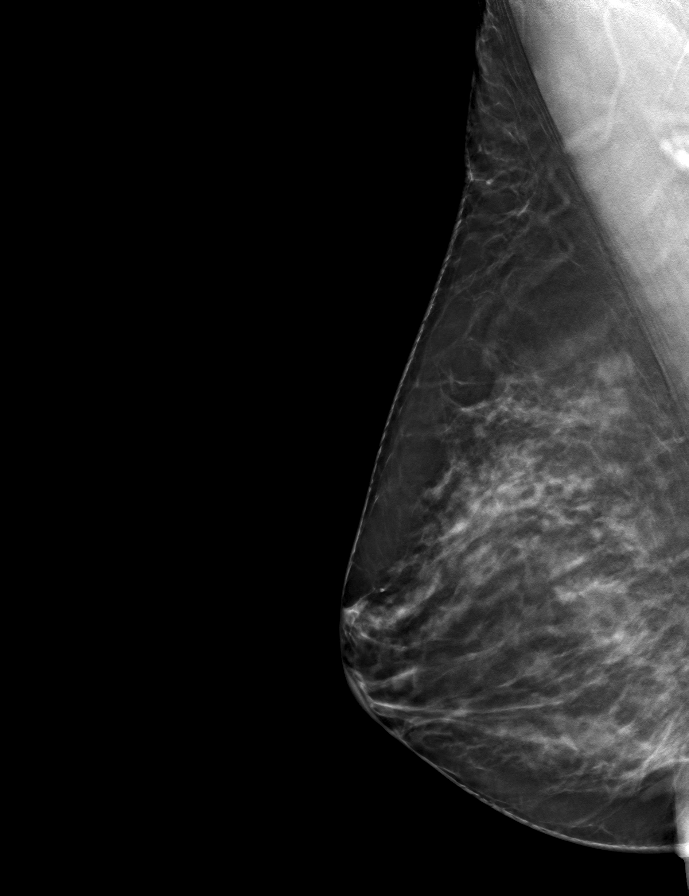

[R CC tomo · tomo slice 39/78.0]
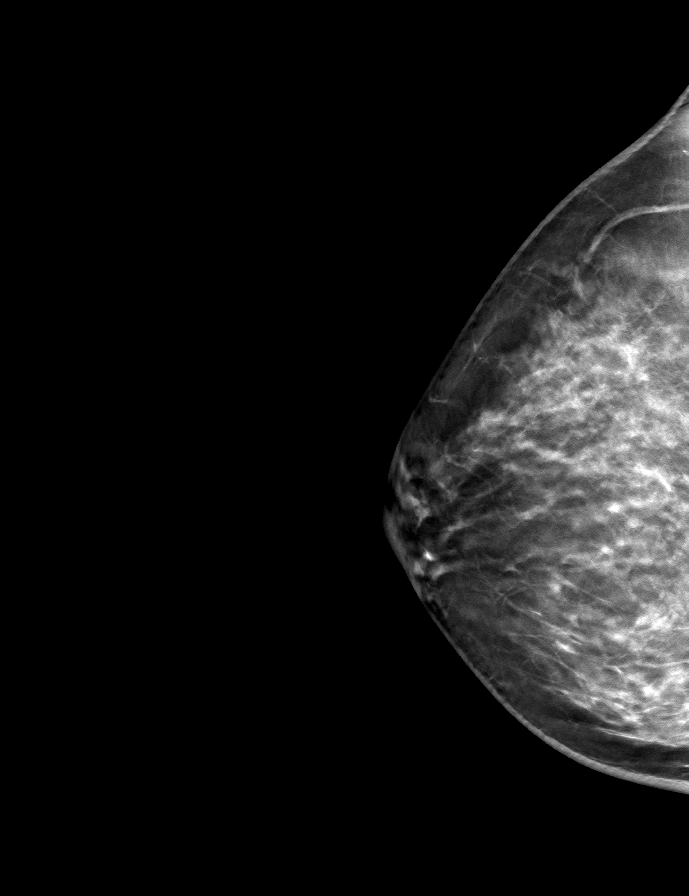

[L CC tomo · tomo slice 39/77.0]
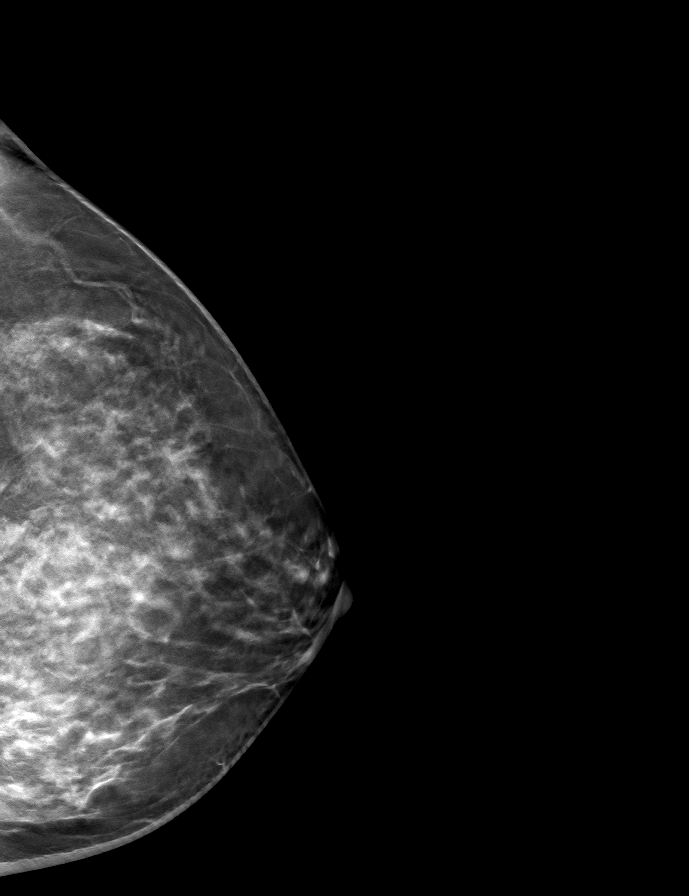

[L MLO tomo · tomo slice 41/82.0]
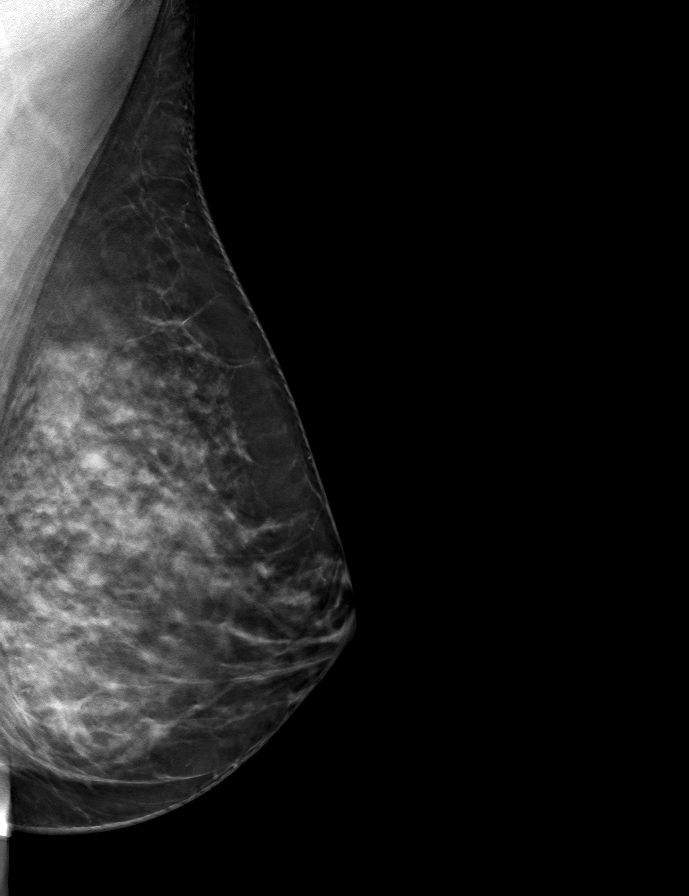

[9 of 24 positions shown; findings below may reference images not displayed]

ACR Breast Density Category c: The breast tissue is heterogeneously
dense, which may obscure small masses.
FINDINGS: There are no findings suspicious for malignancy.
IMPRESSION: No mammographic evidence of malignancy. A result letter of this
screening mammogram will be mailed directly to the patient.

RECOMMENDATION:
Screening mammogram in one year. (Code:Q3-W-BC3)

BI-RADS CATEGORY  1: Negative.

## 2023-01-14 ENCOUNTER — Encounter: Payer: Self-pay | Admitting: Family

## 2023-01-14 ENCOUNTER — Ambulatory Visit: Payer: Medicaid Other | Admitting: Family

## 2023-01-14 VITALS — BP 135/80 | HR 80 | Temp 96.9°F | Ht 61.0 in | Wt 159.6 lb

## 2023-01-14 DIAGNOSIS — L80 Vitiligo: Secondary | ICD-10-CM

## 2023-01-14 MED ORDER — MOMETASONE FUROATE 0.1 % EX CREA: TOPICAL_CREAM | Freq: Every day | CUTANEOUS | 1 refills | Status: DC

## 2023-01-14 MED ORDER — PREDNISONE 10 MG PO TABS: ORAL_TABLET | ORAL | 0 refills | Status: DC

## 2023-01-14 NOTE — Addendum Note (Signed)
Addended by: Jannifer Rodney A on: 01/14/2023 11:02 AM   Modules accepted: Level of Service

## 2023-01-14 NOTE — Patient Instructions (Signed)
Vitiligo Vitiligo is a long-term, or chronic, skin disease that causes loss of skin color in patches (depigmentation). In this condition, areas of the skin that lose pigment turn milky white. This condition affects all types of skin but is most apparent in people with darker skin. Some people may also lose color in their hair or eyes or inside their mouths. Vitiligo usually starts before age 63, but it can occur at any age. There are two main types of vitiligo: Nonsegmental or bilateral vitiligo. This is the more common type. It affects both sides of the body, usually on the face, hands, arms, knees, or feet. This type starts suddenly and may come and go over your lifetime. Segmental or unilateral vitiligo. This type happens on only one side of the body, often on the face, arm, or leg. Some people also lose hair color. This type may start at an early age, get worse for a while, and then stop. Vitiligo is not spread from person to person. It is not dangerous to your health but can cause embarrassment and emotional stress. Some people with this condition may have a higher risk for hearing or vision loss. What are the causes? The exact cause of vitiligo is unknown. It may be an autoimmune disease. This is a type of disease that makes your body's defense system (immune system) mistakenly attack normal cells instead of germs. If your immune system attacks color-producing cells (melanocytes), it may cause vitiligo. When these cells die, you lose color. Sometimes, vitiligo is passed down through families, so it may also be a genetic condition. What increases the risk? The following factors may make you more likely to develop this condition: Having a family history of the condition. Having another autoimmune disease, such as rheumatoid arthritis, autoimmune thyroid disease, or type 1 diabetes. What are the signs or symptoms?  The only sign of this condition is a loss of color in your skin, hair, or eye, or  inside of your mouth. How is this diagnosed? This condition may be diagnosed by patches of depigmentation on your skin. You may also have blood tests to check for other types of autoimmune disease. How is this treated? There is no cure for this condition, and it is not dangerous to your health. However, you may choose to treat vitiligo if having it makes you feel uncomfortable or stressed about your appearance. Treatment will not cure your condition, but it may restore some skin color or prevent your condition from getting worse. Treatment options include: Makeup or self-tanning lotions. These can add color in depigmented areas. Hair dye may be used for white patches of hair. Skin creams, such as steroid creams, vitamin D creams, and creams that block the immune system. Ultraviolet light treatment. Medicines that cause depigmentation. These may be used to lighten darker skin so that white patches will be less visible. Surgery to remove pigmented skin from one area and place it in depigmented areas (skin grafting) or to add color with surgical tattooing. Follow these instructions at home: Take over-the-counter and prescription medicines only as told by your health care provider. Protect your skin from the sun. Sunburn can make vitiligo worse. Use sunscreen with an SPF of 30 or higher. Cover areas of affected skin with clothing or wear a hat when you are out in the sun. Manage stress. Stress can make this condition worse. Seek counseling and support if having vitiligo makes you feel anxious or depressed. Keep all follow-up visits. This is important. Contact a   health care provider if: Your condition is getting worse. You are struggling with depression or anxiety. You notice any changes in hearing or vision. Get help right away if: Your condition is quickly getting worse. If you ever feel like you may hurt yourself or others, or have thoughts about taking your own life, get help right away. Go to  your nearest emergency department or: Call your local emergency services (911 in the [place] Call a suicide crisis helpline, such as the National Suicide Prevention Lifeline at 1-800-273-8255 or 988 in the [place] This is open 24 hours a day in the [place] Text the Crisis Text Line at 741741 (in the [place] Summary Vitiligo is a long-term (chronic) skin disease that causes loss of skin color in patches. Some people with this condition may also lose color in their hair or eyes or inside their mouths. This condition is diagnosed by the signs of depigmentation. Blood testing may be done to look for other types of autoimmune disease. There is no cure for this condition, and it is not dangerous to your health. If your condition makes you feel uncomfortable or stressed about your appearance, you may consider treatment, such as skin creams or medicines. This information is not intended to replace advice given to you by your health care provider. Make sure you discuss any questions you have with your health care provider. Document Revised: 01/31/2021 Document Reviewed: 12/27/2019 Elsevier Patient Education  2024 Elsevier Inc.  

## 2023-01-14 NOTE — Progress Notes (Signed)
Subjective:    Patient ID: Rachel Brooks, female    DOB: Jun 24, 1961, 62 y.o.   MRN: [id number]  Chief Complaint  Patient presents with   white spots on left side of face    Patient states they have been there for a few years but now are bigger and more noticeable.     HPI PT presents to the office today with skin lesion on left cheek and left lower leg that she has noticed years ago, but has become larger and more noticeable in the last few months. Reports she has noticed it spreading on bilateral legs, face, and left arm.  She has used moisturizer  without relief.   Denies any fever, pain, itching,or redness.    Review of Systems  Skin:  Positive for rash.  All other systems reviewed and are negative.      Objective:   Physical Exam Vitals reviewed.  Constitutional:      General: She is not in acute distress.    Appearance: She is well-developed.  HENT:     Head: Normocephalic and atraumatic.     Right Ear: External ear normal.  Eyes:     Pupils: Pupils are equal, round, and reactive to light.  Neck:     Thyroid: No thyromegaly.  Cardiovascular:     Rate and Rhythm: Normal rate and regular rhythm.     Heart sounds: Normal heart sounds. No murmur heard. Pulmonary:     Effort: Pulmonary effort is normal. No respiratory distress.     Breath sounds: Normal breath sounds. No wheezing.  Abdominal:     General: Bowel sounds are normal. There is no distension.     Palpations: Abdomen is soft.     Tenderness: There is no abdominal tenderness.  Musculoskeletal:        General: No tenderness. Normal range of motion.     Cervical back: Normal range of motion and neck supple.  Skin:    General: Skin is warm and dry.     Findings: Rash present.          Comments: Scattered mall circular areas that are depigmented  Neurological:     Mental Status: She is alert and oriented to person, place, and time.     Cranial Nerves: No cranial nerve deficit.     Deep Tendon Reflexes:  Reflexes are normal and symmetric.  Psychiatric:        Behavior: Behavior normal.        Thought Content: Thought content normal.        Judgment: Judgment normal.      BP 135/80   Pulse 80   Temp (!) 96.9 F (36.1 C) (Temporal)   Ht 5\' 1"  (1.549 m)   Wt 159 lb 9.6 oz (72.4 kg)   SpO2 99%   BMI 30.16 kg/m       Assessment & Plan:   Rachel Brooks comes in today with chief complaint of white spots on left side of face (Patient states they have been there for a few years but now are bigger and more noticeable. )   Diagnosis and orders addressed:  1. Vitiligo Start oral steroids today and steroid cream Referral to Derm pending Stress management  Protect skein from sun Follow up as needed  - mometasone (ELOCON) 0.1 % cream; Apply topically daily.  Dispense: 45 g; Refill: 1 - predniSONE (DELTASONE) 10 MG tablet; Days 1-4 take 4 tablets (40 mg) daily  Days 5-8 take 3 tablets (  30 mg) daily, Days 9-11 take 2 tablets (20 mg) daily, Days 12-14 take 1 tablet (10 mg) daily.  Dispense: 37 tablet; Refill: 0 - Ambulatory referral to Dermatology  Jannifer Rodney, FNP

## 2023-01-16 ENCOUNTER — Telehealth: Payer: Self-pay | Admitting: Family Medicine

## 2023-01-16 NOTE — Telephone Encounter (Signed)
Pt says that DR [facility] does not accept her insurance.

## 2023-05-26 ENCOUNTER — Ambulatory Visit: Payer: Medicaid Other | Admitting: Dermatology

## 2023-05-26 ENCOUNTER — Telehealth: Payer: Self-pay | Admitting: Family Medicine

## 2023-05-26 NOTE — Telephone Encounter (Signed)
Pt called to let PCP know that the Dermatology office in [place] that we referred her to, called her this morning to cancel her appt for today because doctor is out sick. Says she was told that next available is in April 2025. Wants to know if we can send her somewhere else that takes her Medicaid insurance, that can get her in sooner, because she can't wait until April.

## 2023-05-27 NOTE — Telephone Encounter (Signed)
I have redirected her Referral to [facility] - I have also sent her a MyChart letter with their information.

## 2023-05-28 NOTE — Telephone Encounter (Signed)
Pt has been notified.

## 2023-06-18 ENCOUNTER — Telehealth: Payer: Self-pay | Admitting: Family Medicine

## 2023-06-18 NOTE — Telephone Encounter (Signed)
  pLEASE SCHEDULE  Copied from CRM [id number]. Topic: Appointments - Scheduling Inquiry for Clinic >> Jun 18, 2023  2:43 PM Mosetta Putt H wrote: Patient needs to schedule mamogram

## 2023-06-23 ENCOUNTER — Ambulatory Visit (INDEPENDENT_AMBULATORY_CARE_PROVIDER_SITE_OTHER): Payer: Medicaid Other | Admitting: Family Medicine

## 2023-06-23 ENCOUNTER — Encounter: Payer: Self-pay | Admitting: Family Medicine

## 2023-06-23 ENCOUNTER — Encounter: Payer: 59 | Admitting: Nurse Practitioner

## 2023-06-23 VITALS — BP 132/78 | HR 81 | Temp 97.8°F | Ht 61.0 in | Wt 158.4 lb

## 2023-06-23 DIAGNOSIS — Z130 Encounter for screening for diseases of the blood and blood-forming organs and certain disorders involving the immune mechanism: Secondary | ICD-10-CM

## 2023-06-23 DIAGNOSIS — R7303 Prediabetes: Secondary | ICD-10-CM | POA: Diagnosis not present

## 2023-06-23 DIAGNOSIS — Z13220 Encounter for screening for lipoid disorders: Secondary | ICD-10-CM | POA: Diagnosis not present

## 2023-06-23 DIAGNOSIS — Z1329 Encounter for screening for other suspected endocrine disorder: Secondary | ICD-10-CM | POA: Diagnosis not present

## 2023-06-23 DIAGNOSIS — Z13228 Encounter for screening for other metabolic disorders: Secondary | ICD-10-CM

## 2023-06-23 DIAGNOSIS — Z0001 Encounter for general adult medical examination with abnormal findings: Secondary | ICD-10-CM | POA: Diagnosis not present

## 2023-06-23 DIAGNOSIS — Z0000 Encounter for general adult medical examination without abnormal findings: Secondary | ICD-10-CM

## 2023-06-23 LAB — BAYER DCA HB A1C WAIVED: HB A1C (BAYER DCA - WAIVED): 6 % — ABNORMAL HIGH (ref 4.8–5.6)

## 2023-06-23 NOTE — Progress Notes (Signed)
Complete physical exam  Patient: Rachel Brooks   DOB: 1960/10/18   63 y.o. Female  MRN: [id number]  Subjective:    Chief Complaint  Patient presents with   Annual Exam    Rachel Brooks is a 62 y.o. female who presents today for a complete physical exam. She reports consuming a general diet. She walks each morning. She generally feels well. She reports sleeping well. She does not have additional problems to discuss today.    Most recent fall risk assessment:    06/23/2023    7:59 AM  Fall Risk   Falls in the past year? 0     Most recent depression screenings:    06/23/2023    8:00 AM 06/17/2022   10:11 AM  PHQ 2/9 Scores  PHQ - 2 Score 0 0  PHQ- 9 Score 0 0    Vision:Not within last year  and Dental: No current dental problems and No regular dental care   Past Medical History:  Diagnosis Date   Allergy    Anemia, unspecified 06/26/2015   10/1 IMO update   Prediabetes 05/17/2021      Patient Care Team: Gabriel Earing, FNP as PCP - General (Family Medicine) Silas Flood, MD as Referring Physician (Obstetrics and Gynecology) Corbin Ade, MD as Consulting Physician (Gastroenterology)   Outpatient Medications Prior to Visit  Medication Sig   mometasone (ELOCON) 0.1 % cream Apply topically daily.   gabapentin (NEURONTIN) 300 MG capsule TAKE 1 CAPSULE BY MOUTH THREE TIMES A DAY (Patient not taking: Reported on 01/14/2023)   [DISCONTINUED] predniSONE (DELTASONE) 10 MG tablet Days 1-4 take 4 tablets (40 mg) daily  Days 5-8 take 3 tablets (30 mg) daily, Days 9-11 take 2 tablets (20 mg) daily, Days 12-14 take 1 tablet (10 mg) daily.   No facility-administered medications prior to visit.    ROS Negative unless specially indicated above in HPI.      Objective:     BP 132/78   Pulse 81   Temp 97.8 F (36.6 C) (Temporal)   Ht 5\' 1"  (1.549 m)   Wt 158 lb 6 oz (71.8 kg)   SpO2 98%   BMI 29.92 kg/m    Physical Exam Vitals and nursing note reviewed.   Constitutional:      General: She is not in acute distress.    Appearance: Normal appearance. She is not ill-appearing, toxic-appearing or diaphoretic.  HENT:     Head: Normocephalic.     Right Ear: Tympanic membrane, ear canal and external ear normal.     Left Ear: Tympanic membrane, ear canal and external ear normal.     Nose: Nose normal.     Mouth/Throat:     Mouth: Mucous membranes are moist.     Pharynx: Oropharynx is clear.  Eyes:     Extraocular Movements: Extraocular movements intact.     Conjunctiva/sclera: Conjunctivae normal.     Pupils: Pupils are equal, round, and reactive to light.  Neck:     Thyroid: No thyroid mass, thyromegaly or thyroid tenderness.     Vascular: No carotid bruit.  Cardiovascular:     Rate and Rhythm: Normal rate and regular rhythm.     Pulses: Normal pulses.     Heart sounds: Normal heart sounds. No murmur heard.    No friction rub. No gallop.  Pulmonary:     Effort: Pulmonary effort is normal.     Breath sounds: Normal breath sounds.  Abdominal:  General: Bowel sounds are normal. There is no distension.     Palpations: Abdomen is soft. There is no mass.     Tenderness: There is no abdominal tenderness. There is no guarding.  Musculoskeletal:        General: No swelling or tenderness. Normal range of motion.     Cervical back: Normal range of motion and neck supple. No tenderness.     Right lower leg: No edema.     Left lower leg: No edema.  Skin:    General: Skin is warm and dry.     Capillary Refill: Capillary refill takes less than 2 seconds.     Findings: No lesion or rash.  Neurological:     General: No focal deficit present.     Mental Status: She is alert and oriented to person, place, and time.     Cranial Nerves: No cranial nerve deficit.     Motor: No weakness.     Gait: Gait normal.  Psychiatric:        Mood and Affect: Mood normal.        Behavior: Behavior normal.        Thought Content: Thought content normal.         Judgment: Judgment normal.      No results found for any visits on 06/23/23.     Assessment & Plan:    Routine Health Maintenance and Physical Exam  Rachel Brooks was seen today for annual exam.  Diagnoses and all orders for this visit:  Routine general medical examination at a health care facility  Prediabetes A1c 6.0 today, increased from 5.9 previously. Discussed diet, exercise, weight management. Fasting labs pending.  -     CBC with Differential/Platelet -     CMP14+EGFR -     Lipid panel -     Bayer DCA Hb A1c Waived  Screening for endocrine, metabolic and immunity disorder -     Bayer DCA Hb A1c Waived -     TSH  Encounter for screening for lipid disorder -     Lipid panel    Immunization History  Administered Date(s) Administered   Influenza Inj Mdck Quad Pf 04/16/2019   Influenza,inj,Quad PF,6+ Mos 05/02/2020, 04/09/2021, 05/08/2022   Moderna SARS-COV2 Booster Vaccination 04/03/2022   Moderna Sars-Covid-2 Vaccination 10/11/2019, 11/08/2019, 06/04/2020, 01/05/2021   Tdap 12/02/2016   Zoster Recombinant(Shingrix) 05/16/2021, 10/12/2021    Health Maintenance  Topic Date Due   INFLUENZA VACCINE  10/20/2023 (Originally 02/20/2023)   COVID-19 Vaccine (6 - 2023-24 season) 07/08/2024 (Originally 03/23/2023)   MAMMOGRAM  06/17/2024   Cervical Cancer Screening (HPV/Pap Cotest)  05/16/2026   DTaP/Tdap/Td (2 - Td or Tdap) 12/03/2026   Colonoscopy  09/14/2029   Hepatitis C Screening  Completed   HIV Screening  Completed   Zoster Vaccines- Shingrix  Completed   HPV VACCINES  Aged Out    Discussed health benefits of physical activity, and encouraged her to engage in regular exercise appropriate for her age and condition.  Problem List Items Addressed This Visit       Other   Prediabetes   Relevant Orders   CBC with Differential/Platelet   CMP14+EGFR   Lipid panel   Bayer DCA Hb A1c Waived   Other Visit Diagnoses     Routine general medical examination at a  health care facility    -  Primary   Screening for endocrine, metabolic and immunity disorder       Relevant Orders  Bayer DCA Hb A1c Waived   TSH   Encounter for screening for lipid disorder       Relevant Orders   Lipid panel      Return in 1 year (on 06/22/2024).   The patient indicates understanding of these issues and agrees with the plan.  Gabriel Earing, FNP

## 2023-06-23 NOTE — Telephone Encounter (Signed)
Appt scheduled for 07/27/2022

## 2023-06-23 NOTE — Patient Instructions (Signed)

## 2023-06-24 LAB — LIPID PANEL
Cholesterol, Total: 141 mg/dL (ref 100–199)
HDL: 48 mg/dL (ref >39)
LDL CALC COMMENT:: 2.9 ratio (ref 0.0–4.4)
LDL Chol Calc (NIH): 80 mg/dL (ref 0–99)
Triglycerides: 63 mg/dL (ref 0–149)
VLDL Cholesterol Cal: 13 mg/dL (ref 5–40)

## 2023-06-24 LAB — CBC WITH DIFFERENTIAL/PLATELET
Basos: 1 %
EOS (ABSOLUTE): 0 10*3/uL (ref 0.0–0.2)
Eos: 4 %
Hematocrit: 39.2 % (ref 34.0–46.6)
Hemoglobin: 12.6 g/dL (ref 11.1–15.9)
Immature Granulocytes: 0 %
Immature Granulocytes: 0 10*3/uL (ref 0.0–0.1)
Lymphs: 51 %
MCH: 29.9 pg (ref 26.6–33.0)
MCHC: 32.1 g/dL (ref 31.5–35.7)
MCV: 93 fL (ref 79–97)
Monocytes Absolute: 0.1 10*3/uL (ref 0.0–0.4)
Monocytes Absolute: 0.6 10*3/uL (ref 0.1–0.9)
Monocytes: 16 %
Neutrophils Absolute: 1 10*3/uL — ABNORMAL LOW (ref 1.4–7.0)
Neutrophils Absolute: 1.9 10*3/uL (ref 0.7–3.1)
Neutrophils: 28 %
Platelets: 277 10*3/uL (ref 150–450)
RBC: 4.21 x10E6/uL (ref 3.77–5.28)
RDW: 12 % (ref 11.7–15.4)
WBC: 3.6 10*3/uL (ref 3.4–10.8)

## 2023-06-24 LAB — CMP14+EGFR
ALT: 16 [IU]/L (ref 0–32)
AST: 24 [IU]/L (ref 0–40)
Albumin: 4 g/dL (ref 3.9–4.9)
Alkaline Phosphatase: 104 [IU]/L (ref 44–121)
BUN/Creatinine Ratio: 13 (ref 12–28)
BUN: 11 mg/dL (ref 8–27)
Bilirubin Total: 0.2 mg/dL (ref 0.0–1.2)
CO2: 25 mmol/L (ref 20–29)
Calcium: 8.9 mg/dL (ref 8.7–10.3)
Chloride: 105 mmol/L (ref 96–106)
Creatinine, Ser: 0.88 mg/dL (ref 0.57–1.00)
Globulin, Total: 3.2 g/dL (ref 1.5–4.5)
Glucose: 103 mg/dL — ABNORMAL HIGH (ref 70–99)
Potassium: 3.9 mmol/L (ref 3.5–5.2)
Sodium: 141 mmol/L (ref 134–144)
Total Protein: 7.2 g/dL (ref 6.0–8.5)
eGFR: 74 mL/min/{1.73_m2} (ref >59)

## 2023-06-24 LAB — TSH: TSH: 3.8 u[IU]/mL (ref 0.450–4.500)

## 2023-07-01 DIAGNOSIS — L209 Atopic dermatitis, unspecified: Secondary | ICD-10-CM | POA: Diagnosis not present

## 2023-07-01 DIAGNOSIS — L649 Androgenic alopecia, unspecified: Secondary | ICD-10-CM | POA: Diagnosis not present

## 2023-07-10 ENCOUNTER — Other Ambulatory Visit: Payer: Self-pay | Admitting: Family Medicine

## 2023-07-10 DIAGNOSIS — Z1231 Encounter for screening mammogram for malignant neoplasm of breast: Secondary | ICD-10-CM

## 2023-07-28 ENCOUNTER — Inpatient Hospital Stay: Admission: RE | Admit: 2023-07-28 | Payer: Medicaid Other | Source: Ambulatory Visit

## 2023-08-13 ENCOUNTER — Ambulatory Visit
Admission: RE | Admit: 2023-08-13 | Discharge: 2023-08-13 | Disposition: A | Discharge status: Home / Self Care | Payer: Medicaid Other | Source: Ambulatory Visit | Attending: Family Medicine | Admitting: Family Medicine

## 2023-08-13 DIAGNOSIS — Z1231 Encounter for screening mammogram for malignant neoplasm of breast: Secondary | ICD-10-CM

## 2023-09-07 DIAGNOSIS — H5213 Myopia, bilateral: Secondary | ICD-10-CM | POA: Diagnosis not present

## 2023-09-23 DIAGNOSIS — L649 Androgenic alopecia, unspecified: Secondary | ICD-10-CM | POA: Diagnosis not present

## 2023-11-03 ENCOUNTER — Ambulatory Visit: Payer: Medicaid Other | Admitting: Dermatology

## 2023-11-04 ENCOUNTER — Telehealth: Payer: Self-pay | Admitting: Family Medicine

## 2023-11-04 DIAGNOSIS — L209 Atopic dermatitis, unspecified: Secondary | ICD-10-CM | POA: Diagnosis not present

## 2023-11-04 DIAGNOSIS — L80 Vitiligo: Secondary | ICD-10-CM | POA: Diagnosis not present

## 2023-11-04 NOTE — Telephone Encounter (Signed)
 Copied from CRM [id number]. Topic: Clinical - Medication Question >> Nov 04, 2023  5:16 PM Chuck Crater wrote: Reason for CRM: Patient is calling to check the status of approval for topical cream. She stated that this will be her first time using it and was unsure of name.

## 2023-11-05 NOTE — Telephone Encounter (Signed)
 Looks like it Elocon cream was ordered by [facility] in June. Referral to dermatology was also placed that that time. Please like sure she has follow up with dermatology. Maybe she is thinking of something that derm has sent in?

## 2023-11-05 NOTE — Telephone Encounter (Signed)
 Clarification given. Pt was referring to a cream given by dermatology.

## 2023-11-10 ENCOUNTER — Ambulatory Visit: Admitting: Dermatology

## 2023-12-16 DIAGNOSIS — L80 Vitiligo: Secondary | ICD-10-CM | POA: Diagnosis not present

## 2023-12-16 DIAGNOSIS — L649 Androgenic alopecia, unspecified: Secondary | ICD-10-CM | POA: Diagnosis not present

## 2023-12-22 ENCOUNTER — Encounter: Payer: Self-pay | Admitting: Family Medicine

## 2023-12-22 ENCOUNTER — Ambulatory Visit: Payer: Medicaid Other | Admitting: Family Medicine

## 2023-12-22 VITALS — BP 135/78 | HR 79 | Temp 98.1°F | Ht 61.0 in | Wt 155.0 lb

## 2023-12-22 DIAGNOSIS — E663 Overweight: Secondary | ICD-10-CM

## 2023-12-22 DIAGNOSIS — R7303 Prediabetes: Secondary | ICD-10-CM | POA: Diagnosis not present

## 2023-12-22 LAB — BAYER DCA HB A1C WAIVED: HB A1C (BAYER DCA - WAIVED): 5.8 % — ABNORMAL HIGH (ref 4.8–5.6)

## 2023-12-22 NOTE — Progress Notes (Addendum)
   Established Patient Office Visit  Subjective   Patient ID: Rachel Brooks, female    DOB: 01/18/1961  Age: 63 y.o. MRN: [id number]  Chief Complaint  Patient presents with   Medical Management of Chronic Issues    HPI Rachel Brooks is here for follow up of prediabetes. Last A1c was 6.0. She has reduce calorie intake. She is walking 4 days a week.     Review of Systems  Constitutional:  Negative for chills and fever.  Respiratory:  Negative for shortness of breath.   Cardiovascular:  Negative for chest pain and leg swelling.  Neurological:  Negative for loss of consciousness.      Objective:     BP 135/78   Pulse 79   Temp 98.1 F (36.7 C) (Temporal)   Ht 5\' 1"  (1.549 m)   Wt 155 lb (70.3 kg)   SpO2 100%   BMI 29.29 kg/m  Wt Readings from Last 3 Encounters:  12/22/23 155 lb (70.3 kg)  06/23/23 158 lb 6 oz (71.8 kg)  01/14/23 159 lb 9.6 oz (72.4 kg)   BP Readings from Last 3 Encounters:  12/22/23 135/78  06/23/23 132/78  01/14/23 135/80     Physical Exam Vitals and nursing note reviewed.  Constitutional:      General: She is not in acute distress.    Appearance: Normal appearance. She is not ill-appearing.  Cardiovascular:     Rate and Rhythm: Normal rate and regular rhythm.     Pulses: Normal pulses.     Heart sounds: Normal heart sounds. No murmur heard. Pulmonary:     Effort: Pulmonary effort is normal. No respiratory distress.     Breath sounds: Normal breath sounds.  Abdominal:     Palpations: Abdomen is soft.  Musculoskeletal:     Cervical back: Neck supple. No tenderness.     Right lower leg: No edema.     Left lower leg: No edema.  Lymphadenopathy:     Cervical: No cervical adenopathy.  Skin:    General: Skin is warm and dry.  Neurological:     General: No focal deficit present.     Mental Status: She is alert and oriented to person, place, and time.  Psychiatric:        Mood and Affect: Mood normal.        Behavior: Behavior normal.       No results found for any visits on 12/22/23.    The 10-year ASCVD risk score (Arnett DK, et al., 2019) is: 5.9%    Assessment & Plan:   Rachel Brooks was seen today for medical management of chronic issues.  Diagnoses and all orders for this visit:  Prediabetes A1c 5.8 today, lower than previous at 6.0. Discussed diet, exercise, weight loss to manage.  -     Bayer DCA Hb A1c Waived  Overweight Trending down. Diet, exercise.    Return in about 6 months (around 06/22/2024) for CPE.   The patient indicates understanding of these issues and agrees with the plan.  Rachel Huger, FNP

## 2023-12-29 ENCOUNTER — Ambulatory Visit: Payer: Self-pay

## 2023-12-29 NOTE — Telephone Encounter (Signed)
 1st attempt, LVM                   Copied From CRM [id number]. Reason for Triage: Patient saw PCP on Monday but forgot to mention sciatica been bothering her a lot this week. Now bothering the patient daily . Callback number is [phone].

## 2023-12-29 NOTE — Telephone Encounter (Signed)
Second attempt no answer left vm

## 2023-12-31 ENCOUNTER — Encounter: Payer: Self-pay | Admitting: Family Medicine

## 2023-12-31 ENCOUNTER — Telehealth (INDEPENDENT_AMBULATORY_CARE_PROVIDER_SITE_OTHER): Admitting: Family Medicine

## 2023-12-31 ENCOUNTER — Telehealth: Payer: Self-pay

## 2023-12-31 DIAGNOSIS — M5432 Sciatica, left side: Secondary | ICD-10-CM | POA: Diagnosis not present

## 2023-12-31 MED ORDER — PREDNISONE 20 MG PO TABS: 40.0000 mg | ORAL_TABLET | Freq: Every day | ORAL | 0 refills | Status: AC

## 2023-12-31 MED ORDER — TIZANIDINE HCL 2 MG PO TABS: 2.0000 mg | ORAL_TABLET | Freq: Three times a day (TID) | ORAL | 0 refills | Status: DC | PRN

## 2023-12-31 NOTE — Telephone Encounter (Signed)
 3 attempts were made by triage nurse without a return call, will close encounter.

## 2023-12-31 NOTE — Telephone Encounter (Signed)
 Schedule video visit

## 2023-12-31 NOTE — Telephone Encounter (Signed)
NTBS.

## 2023-12-31 NOTE — Progress Notes (Signed)
 Virtual Visit via Video Note  I connected with Rachel Brooks on 12/31/23 at  4:45 PM EDT by a video enabled telemedicine application and verified that I am speaking with the correct person using two identifiers.  Patient Location: Home Provider Location: Office/Clinic  I discussed the limitations, risks, security, and privacy concerns of performing an evaluation and management service by video and the availability of in person appointments. I also discussed with the patient that there may be a patient responsible charge related to this service. The patient expressed understanding and agreed to proceed.  Subjective: PCP: Albertha Huger, FNP  Chief Complaint  Patient presents with   Leg Pain   HPI States that her sciatic pain is flaring. States that she has had it for years as a twinge but that it has worsened in the last few days. Reports that symptoms are better with moving. She is stretching daily. States that when she is standing her pain is worse. She has a topical gel that she used once and it helped some. Pain is on the Left side.  Denies back pain with it, has history of slipped disc from prior trauma.  Endorses numbness and tingling from buttock to side of her leg.  She is not currently taking gabapentin .  Denies saddle anesthesia. Denies incontinence.   ROS: Per HPI  Current Outpatient Medications:    finasteride (PROSCAR) 5 MG tablet, Take 5 mg by mouth daily., Disp: , Rfl:    gabapentin  (NEURONTIN ) 300 MG capsule, TAKE 1 CAPSULE BY MOUTH THREE TIMES A DAY, Disp: 270 capsule, Rfl: 0   minoxidil (LONITEN) 2.5 MG tablet, Take 2.5 mg by mouth daily., Disp: , Rfl:    tacrolimus (PROTOPIC) 0.1 % ointment, Apply topically., Disp: , Rfl:   Observations/Objective: There were no vitals filed for this visit. Physical Exam Constitutional:      General: She is awake. She is not in acute distress.    Appearance: Normal appearance. She is well-developed and well-groomed.  She is not ill-appearing, toxic-appearing or diaphoretic.  Pulmonary:     Effort: Pulmonary effort is normal.  Neurological:     General: No focal deficit present.     Mental Status: She is alert and oriented to person, place, and time.  Psychiatric:        Attention and Perception: Attention and perception normal.        Mood and Affect: Mood and affect normal.        Speech: Speech normal.        Behavior: Behavior normal. Behavior is cooperative.        Thought Content: Thought content normal.        Cognition and Memory: Cognition and memory normal.        Judgment: Judgment normal.    Assessment and Plan: 1. Sciatica, left side (Primary) Will start medications as below.  Continue stretching.  Discussed side effects of medications with patient. Follow up as needed.  - predniSONE  (DELTASONE ) 20 MG tablet; Take 2 tablets (40 mg total) by mouth daily with breakfast for 5 days.  Dispense: 10 tablet; Refill: 0 - tiZANidine (ZANAFLEX) 2 MG tablet; Take 1 tablet (2 mg total) by mouth every 8 (eight) hours as needed for muscle spasms.  Dispense: 30 tablet; Refill: 0  Appt time 12/31/2023 04:45 PM Call duration: 00:07:35  Follow Up Instructions: Return if symptoms worsen or fail to improve.   I discussed the assessment and treatment plan with the patient. The patient was  provided an opportunity to ask questions, and all were answered. The patient agreed with the plan and demonstrated an understanding of the instructions.   The patient was advised to call back or seek an in-person evaluation if the symptoms worsen or if the condition fails to improve as anticipated.  The above assessment and management plan was discussed with the patient. The patient verbalized understanding of and has agreed to the management plan.   Jacqualyn Mates, DNP-FNP [facility] [address] [place], [place] [address] [phone]

## 2023-12-31 NOTE — Telephone Encounter (Signed)
 Copied from CRM [id number]. Topic: Clinical - Medical Advice >> Dec 31, 2023  8:27 AM Rachel Brooks H wrote: Reason for CRM: Pt is wanting to know if her provider can prescribe her something for sciatica pain.

## 2024-01-13 ENCOUNTER — Ambulatory Visit: Payer: Self-pay

## 2024-01-13 NOTE — Telephone Encounter (Signed)
 FYI Only or Action Required?: Action required by provider: clinical question for provider.  Patient was last seen in primary care on 12/31/2023 by Cathlene Marry Lenis, FNP. Called Nurse Triage reporting Back Pain. Symptoms began several weeks ago. Interventions attempted: Prescription medications: Prednisone , muscle relaxer, did not help much per pt.. Symptoms are: gradually worsening.  Triage Disposition: See HCP Within 4 Hours (Or PCP Triage)  Patient/caregiver understands and will follow disposition?: No, refuses dispositionCopied from CRM [id number]. Topic: Appointments - Appointment Scheduling  Pt. Has continued back pain, medications did not help. States medications did not help, asking for advice.  >> Jan 13, 2024 11:46 AM Tiffany H wrote: Red Word: Patient called to advise that she's having lingering issues with sciatic pain. Patient has a history of slipped disc but this pain isn't the same. No back pain, just left side pain. Patient reported the following:   Sitting down to use the bathroom  - pain on the left side. Patient works 3 hours a morning, no problem walking around. Pain starts later on in the evening. It's hugely disruptive. It's unpredictable, and never happens before noon.   Yesterday, patient had a regular morning. She went to the dentists office and while waiting, the pain was so bad that she had to take a moment to sit down. She then got up and was fine - there were lingering twinges while she was walking around. Some days are worse than others. Pinky toe stays numb.   Patient recently had telemed visit with prednisone  and tanaflex prescribed, but the pain remains disruptive and unpredictable. Please assist. Reason for Disposition  [1] SEVERE back pain (e.g., excruciating, unable to do any normal activities) AND [2] not improved 2 hours after pain medicine  Answer Assessment - Initial Assessment Questions 1. ONSET: When did the pain begin?      2 weeks ago 2.  LOCATION: Where does it hurt? (upper, mid or lower back)     Side pain, left buttock 3. SEVERITY: How bad is the pain?  (e.g., Scale 1-10; mild, moderate, or severe)   - MILD (1-3): Doesn't interfere with normal activities.    - MODERATE (4-7): Interferes with normal activities or awakens from sleep.    - SEVERE (8-10): Excruciating pain, unable to do any normal activities.      Severe 4. PATTERN: Is the pain constant? (e.g., yes, no; constant, intermittent)      Comes and goes 5. RADIATION: Does the pain shoot into your legs or somewhere else?     Yes 6. CAUSE:  What do you think is causing the back pain?      sciatica 7. BACK OVERUSE:  Any recent lifting of heavy objects, strenuous work or exercise?     no 8. MEDICINES: What have you taken so far for the pain? (e.g., nothing, acetaminophen, NSAIDS)     Prednisone , muscle relaxer 9. NEUROLOGIC SYMPTOMS: Do you have any weakness, numbness, or problems with bowel/bladder control?     no 10. OTHER SYMPTOMS: Do you have any other symptoms? (e.g., fever, abdomen pain, burning with urination, blood in urine)       no 11. PREGNANCY: Is there any chance you are pregnant? When was your last menstrual period?       no  Protocols used: Back Pain-A-AH

## 2024-01-16 ENCOUNTER — Telehealth (INDEPENDENT_AMBULATORY_CARE_PROVIDER_SITE_OTHER): Admitting: Family Medicine

## 2024-01-16 ENCOUNTER — Encounter: Payer: Self-pay | Admitting: Family Medicine

## 2024-01-16 DIAGNOSIS — M5431 Sciatica, right side: Secondary | ICD-10-CM | POA: Diagnosis not present

## 2024-01-16 MED ORDER — GABAPENTIN 300 MG PO CAPS: 300.0000 mg | ORAL_CAPSULE | Freq: Three times a day (TID) | ORAL | 0 refills | Status: DC

## 2024-01-16 NOTE — Progress Notes (Signed)
 MyChart Video visit  Subjective: CC: sciatica PCP: Joesph Annabella HERO, FNP YEP:Rachel Brooks is a 63 y.o. female. Patient provides verbal consent for consult held via video.  Due to COVID-19 pandemic this visit was conducted virtually. This visit type was conducted due to national recommendations for restrictions regarding the COVID-19 Pandemic (e.g. social distancing, sheltering in place) in an effort to limit this patient's exposure and mitigate transmission in our community. All issues noted in this document were discussed and addressed.  A physical exam was not performed with this format.   Location of patient: home Location of provider: [facility] Others present for call: none  1. Back pain She suffers from sciatic nerve issues on the left side.  She has had it for a couple of months.  She was treated with Prednisone  burst and muscle relaxant and it didn't help at all.  She reports tingling in left leg.  Pinky toe is chronically numb.  Prolonged activity causes pain but usually not bothersome in the am.    ROS: Per HPI  Allergies  Allergen Reactions   Amoxicillin Hives    Did it involve swelling of the face/tongue/throat, SOB, or low BP? No Did it involve sudden or severe rash/hives, skin peeling, or any reaction on the inside of your mouth or nose? Yes Did you need to seek medical attention at a hospital or doctor's office? Yes When did it last happen? More than 10 years ago If all above answers are NO, may proceed with cephalosporin use.    Penicillins Hives    Did it involve swelling of the face/tongue/throat, SOB, or low BP? No Did it involve sudden or severe rash/hives, skin peeling, or any reaction on the inside of your mouth or nose? Yes Did you need to seek medical attention at a hospital or doctor's office? Yes When did it last happen? More than 10 years ago If all above answers are "NO", may proceed with cephalosporin use.   Past Medical History:  Diagnosis Date    Allergy    Anemia, unspecified 06/26/2015   10/1 IMO update   Prediabetes 05/17/2021    Current Outpatient Medications:    finasteride (PROSCAR) 5 MG tablet, Take 5 mg by mouth daily., Disp: , Rfl:    gabapentin  (NEURONTIN ) 300 MG capsule, TAKE 1 CAPSULE BY MOUTH THREE TIMES A DAY, Disp: 270 capsule, Rfl: 0   minoxidil (LONITEN) 2.5 MG tablet, Take 2.5 mg by mouth daily., Disp: , Rfl:    tacrolimus (PROTOPIC) 0.1 % ointment, Apply topically., Disp: , Rfl:    tiZANidine  (ZANAFLEX ) 2 MG tablet, Take 1 tablet (2 mg total) by mouth every 8 (eight) hours as needed for muscle spasms., Disp: 30 tablet, Rfl: 0  Assessment/ Plan: 63 y.o. female   Right sided sciatica - Plan: DG Lumbar Spine 2-3 Views, gabapentin  (NEURONTIN ) 300 MG capsule, Ambulatory referral to Physical Therapy  Gabapentin  nightly for 2 days then twice daily for 2 days then can go to 3 times daily dosing if needed.  Referral to physical therapy.  Plain films ordered.  She will have plain films done sometime next week I have encouraged her to contact our office prior to arrival to ensure that our x-ray tech is available to her.  Should she have refractory symptoms despite these treatments, neck step will be referral plus or minus MRI  Start time: 12:37pm End time: 12:47pm  Total time spent on patient care (including video visit/ documentation): 12 minutes  Quintus Premo M Barnes Florek, DO [facility] [phone]

## 2024-01-19 ENCOUNTER — Other Ambulatory Visit (INDEPENDENT_AMBULATORY_CARE_PROVIDER_SITE_OTHER)

## 2024-01-19 DIAGNOSIS — M5441 Lumbago with sciatica, right side: Secondary | ICD-10-CM | POA: Diagnosis not present

## 2024-01-19 DIAGNOSIS — M5431 Sciatica, right side: Secondary | ICD-10-CM | POA: Diagnosis not present

## 2024-01-19 DIAGNOSIS — M4316 Spondylolisthesis, lumbar region: Secondary | ICD-10-CM | POA: Diagnosis not present

## 2024-01-19 DIAGNOSIS — M4807 Spinal stenosis, lumbosacral region: Secondary | ICD-10-CM | POA: Diagnosis not present

## 2024-01-19 DIAGNOSIS — M47816 Spondylosis without myelopathy or radiculopathy, lumbar region: Secondary | ICD-10-CM | POA: Diagnosis not present

## 2024-01-22 ENCOUNTER — Telehealth: Payer: Self-pay

## 2024-01-22 NOTE — Telephone Encounter (Signed)
 Copied from CRM [id number]. Topic: Clinical - Lab/Test Results >> Jan 22, 2024  4:06 PM Rachel Brooks wrote: Reason for CRM: Patient called to check status of Imaging done Monday. Has not heard anything. Showing Exam ended but not final. Patient would like a call back. hank

## 2024-01-22 NOTE — Telephone Encounter (Signed)
 Pt notified that the final results has not resulted. Will call back once it does. LS

## 2024-01-26 ENCOUNTER — Ambulatory Visit: Payer: Self-pay | Admitting: Family Medicine

## 2024-01-26 ENCOUNTER — Ambulatory Visit: Attending: Family Medicine

## 2024-01-26 DIAGNOSIS — M5431 Sciatica, right side: Secondary | ICD-10-CM | POA: Insufficient documentation

## 2024-01-26 DIAGNOSIS — M5416 Radiculopathy, lumbar region: Secondary | ICD-10-CM | POA: Diagnosis not present

## 2024-01-26 NOTE — Therapy (Signed)
 OUTPATIENT PHYSICAL THERAPY THORACOLUMBAR EVALUATION   Patient Name: Rachel Brooks MRN: [id number] DOB:1961/03/18, 63 y.o., female Today's Date: 01/26/2024  END OF SESSION:  PT End of Session - 01/26/24 1350     Visit Number 1    Number of Visits 8    Date for PT Re-Evaluation 03/19/24    PT Start Time 1351    PT Stop Time 1418    PT Time Calculation (min) 27 min    Activity Tolerance Patient tolerated treatment well    Behavior During Therapy [facility] for tasks assessed/performed          Past Medical History:  Diagnosis Date   Allergy    Anemia, unspecified 06/26/2015   10/1 IMO update   Prediabetes 05/17/2021   Past Surgical History:  Procedure Laterality Date   CESAREAN SECTION WITH BILATERAL TUBAL LIGATION  1995   COLONOSCOPY N/A 09/15/2019   Procedure: COLONOSCOPY;  Surgeon: Shaaron Lamar HERO, MD;  Location: [facility];  Service: Endoscopy;  Laterality: N/A;  1:00   Patient Active Problem List   Diagnosis Date Noted   Prediabetes 05/17/2021   Idiopathic peripheral neuropathy 03/19/2021   Post-menopausal 06/26/2015   REFERRING PROVIDER: Jolinda Norene HERO, DO   REFERRING DIAG: Right sided sciatica   Rationale for Evaluation and Treatment: Rehabilitation  THERAPY DIAG:  Radiculopathy, lumbar region  ONSET DATE: 2 months ago  SUBJECTIVE:                                                                                                                                                                                           SUBJECTIVE STATEMENT: Patient reports that she had sciatica before, but it has never been this bad or lasted this long. She notes that it has gotten worse since it first began, but she is unsure what caused it. Her pain starts in her left low back and it can radiate down to her 5th toe of her left foot.   PERTINENT HISTORY:  Allergies  PAIN:  Are you having pain? Yes: NPRS scale: Current: 1/10 Best: 1/10 Worst: 8-9/10 Pain location: left  low back radiating to her left foot Pain description: tingling (like a catch) and numbness in her 5th toe of her left foot Aggravating factors: standing (5 minutes at most) Relieving factors: stretching, touching her toes,   PRECAUTIONS: None  RED FLAGS: None   WEIGHT BEARING RESTRICTIONS: No  FALLS:  Has patient fallen in last 6 months? No  LIVING ENVIRONMENT: Lives with: lives with their family Lives in: Mobile home Stairs: Yes: External: 4 steps; none Has following equipment at home: None  OCCUPATION: cleaning a  building  PLOF: Independent  PATIENT GOALS: be able to walk longer, reduced pain, and be able to stand longer  NEXT MD VISIT: 06/24/24  OBJECTIVE:  Note: Objective measures were completed at Evaluation unless otherwise noted.  DIAGNOSTIC FINDINGS: 01/19/24 IMPRESSION: 1. Degenerative changes in the lower lumbar spine. 2. No acute abnormality in the lumbar spine.  PATIENT SURVEYS:  Modified Oswestry:  MODIFIED OSWESTRY DISABILITY SCALE  Date: 01/26/24 Score  Pain intensity 4 =  Pain medication provides me with little relief from pain.  2. Personal care (washing, dressing, etc.) 0 =  I can take care of myself normally without causing increased pain.  3. Lifting 0 = I can lift heavy weights without increased pain.  4. Walking 2 =  Pain prevents me from walking more than  mile.  5. Sitting 0 =  I can sit in any chair as long as I like.  6. Standing 4 =  Pain prevents me from standing more than 10 minutes.  7. Sleeping 0 = Pain does not prevent me from sleeping well.  8. Social Life 0 = My social life is normal and does not increase my pain.  9. Traveling 0 =  I can travel anywhere without increased pain.  10. Employment/ Homemaking 0 = My normal homemaking/job activities do not cause pain.  Total 10/50   Interpretation of scores: Score Category Description  0-20% Minimal Disability The patient can cope with most living activities. Usually no treatment is  indicated apart from advice on lifting, sitting and exercise  21-40% Moderate Disability The patient experiences more pain and difficulty with sitting, lifting and standing. Travel and social life are more difficult and they may be disabled from work. Personal care, sexual activity and sleeping are not grossly affected, and the patient can usually be managed by conservative means  41-60% Severe Disability Pain remains the main problem in this group, but activities of daily living are affected. These patients require a detailed investigation  61-80% Crippled Back pain impinges on all aspects of the patient's life. Positive intervention is required  81-100% Bed-bound  These patients are either bed-bound or exaggerating their symptoms  Bluford FORBES Zoe DELENA Karon DELENA, et al. Surgery versus conservative management of stable thoracolumbar fracture: the PRESTO feasibility RCT. [place] ([place]): [employer]; 2021 Nov. [facility] Technology Assessment, No. 25.62.) Appendix 3, Oswestry Disability Index category descriptors. Available from: FindJewel[employer]cz  Minimally Clinically Important Difference (MCID) = 12.8%  COGNITION: Overall cognitive status: Within functional limits for tasks assessed     SENSATION: Patient reports no numbness or tingling currently, but has intermittent numbness in 5th toe of her left foot.  PALPATION: No tenderness to palpation reported  LUMBAR ROM:   AROM eval  Flexion 64; familiar pain  Extension 12  Right lateral flexion WFL   Left lateral flexion WFL   Right rotation WFL; slight twinge  Left rotation WFL; slight twinge   (Blank rows = not tested)  LOWER EXTREMITY ROM:     Active  Right eval Left eval  Hip flexion    Hip extension    Hip abduction    Hip adduction    Hip internal rotation    Hip external rotation    Knee flexion    Knee extension  Familiar pain reproduced with end range knee extension   Ankle  dorsiflexion    Ankle plantarflexion    Ankle inversion    Ankle eversion     (Blank rows = not tested)  LOWER EXTREMITY MMT:    MMT Right eval Left eval  Hip flexion 4+/5 4/5  Hip extension    Hip abduction    Hip adduction    Hip internal rotation    Hip external rotation    Knee flexion 4+/5 4+/5  Knee extension 5/5 5/5  Ankle dorsiflexion 4+/5 4+/5  Ankle plantarflexion    Ankle inversion    Ankle eversion     (Blank rows = not tested)  GAIT: Assistive device utilized: None Level of assistance: Complete Independence  TREATMENT DATE:                                                                                                                                  PATIENT EDUCATION:  Education details: POC, healing, x-ray results, objective findings, and goals for physical therapy Person educated: Patient Education method: Explanation Education comprehension: verbalized understanding  HOME EXERCISE PROGRAM:   ASSESSMENT:  CLINICAL IMPRESSION: Patient is a 63 y.o. female who was seen today for physical therapy evaluation and treatment for left lumbar radiculopathy. She presented with low pain severity and irritability with lumbar flexion and left knee extension reproducing her familiar symptoms. Recommend that she continue with skilled physical therapy to address her impairments to return to her prior level of function.    OBJECTIVE IMPAIRMENTS: decreased activity tolerance, decreased ROM, decreased strength, and pain.   ACTIVITY LIMITATIONS: bending and standing  PARTICIPATION LIMITATIONS: cleaning and community activity  PERSONAL FACTORS: Past/current experiences, Time since onset of injury/illness/exacerbation, and 1 comorbidity: allergies are also affecting patient's functional outcome.   REHAB POTENTIAL: Good  CLINICAL DECISION MAKING: Stable/uncomplicated  EVALUATION COMPLEXITY: Low   GOALS: Goals reviewed with patient? Yes  LONG TERM GOALS: Target  date: 02/23/24  Patient will be independent with her HEP.  Baseline:  Goal status: INITIAL  2.  Patient will be able to complete her daily activities without her familiar symptoms exceeding 5/10. Baseline:  Goal status: INITIAL  3.  Patient will improve her ODI score to 8% disability or less for improved perceived function with her daily activities.  Baseline:  Goal status: INITIAL  4.  Patient will report being able to stand for at least 15 minutes for improved function completing her daily activities.  Baseline:  Goal status: INITIAL  PLAN:  PT FREQUENCY: 2x/week  PT DURATION: 4 weeks  PLANNED INTERVENTIONS: 97164- PT Re-evaluation, 97750- Physical Performance Testing, 97110-Therapeutic exercises, 97530- Therapeutic activity, [id number]- Neuromuscular re-education, 97535- Self Care, [id number]- Manual therapy, Patient/Family education, Balance training, Joint mobilization, and Spinal mobilization.  PLAN FOR NEXT SESSION: Nustep, lumbar stabilization, lower trunk rotations, and bridging   Lacinda JAYSON Fass, PT 01/26/2024, 4:55 PM

## 2024-02-03 ENCOUNTER — Ambulatory Visit: Admitting: *Deleted

## 2024-02-03 ENCOUNTER — Other Ambulatory Visit: Payer: Self-pay | Admitting: Family Medicine

## 2024-02-03 ENCOUNTER — Encounter: Payer: Self-pay | Admitting: *Deleted

## 2024-02-03 DIAGNOSIS — M5416 Radiculopathy, lumbar region: Secondary | ICD-10-CM | POA: Diagnosis not present

## 2024-02-03 DIAGNOSIS — M5431 Sciatica, right side: Secondary | ICD-10-CM | POA: Diagnosis not present

## 2024-02-03 NOTE — Therapy (Signed)
 OUTPATIENT PHYSICAL THERAPY THORACOLUMBAR TREATMENT   Patient Name: Rachel Brooks MRN: [id number] DOB:1961-02-03, 63 y.o., female Today's Date: 02/03/2024  END OF SESSION:  PT End of Session - 02/03/24 1445     Visit Number 2    Number of Visits 8    Date for PT Re-Evaluation 03/19/24    Authorization - Visit Number 4     PT Start Time 1430    PT Stop Time 1520    PT Time Calculation (min) 50 min          Past Medical History:  Diagnosis Date   Allergy    Anemia, unspecified 06/26/2015   10/1 IMO update   Prediabetes 05/17/2021   Past Surgical History:  Procedure Laterality Date   CESAREAN SECTION WITH BILATERAL TUBAL LIGATION  1995   COLONOSCOPY N/A 09/15/2019   Procedure: COLONOSCOPY;  Surgeon: Shaaron Lamar HERO, MD;  Location: [facility];  Service: Endoscopy;  Laterality: N/A;  1:00   Patient Active Problem List   Diagnosis Date Noted   Prediabetes 05/17/2021   Idiopathic peripheral neuropathy 03/19/2021   Post-menopausal 06/26/2015   REFERRING PROVIDER: Jolinda Norene HERO, DO   REFERRING DIAG: Right sided sciatica   Rationale for Evaluation and Treatment: Rehabilitation  THERAPY DIAG:  Radiculopathy, lumbar region  ONSET DATE: 2 months ago  SUBJECTIVE:                                                                                                                                                                                           SUBJECTIVE STATEMENT: Patient reports that she had sciatica before, but it has never been this bad or lasted this long and is usually worse in the evening  PERTINENT HISTORY:  Allergies  PAIN:  Are you having pain? Yes: NPRS scale: Current: 3/10 Best: 1/10 Worst: 8-9/10 Pain location: left low back radiating to her left foot Pain description: tingling (like a catch) and numbness in her 5th toe of her left foot Aggravating factors: standing (5 minutes at most) Relieving factors: stretching, touching her toes,    PRECAUTIONS: None  RED FLAGS: None   WEIGHT BEARING RESTRICTIONS: No  FALLS:  Has patient fallen in last 6 months? No  LIVING ENVIRONMENT: Lives with: lives with their family Lives in: Mobile home Stairs: Yes: External: 4 steps; none Has following equipment at home: None  OCCUPATION: cleaning a building  PLOF: Independent  PATIENT GOALS: be able to walk longer, reduced pain, and be able to stand longer  NEXT MD VISIT: 06/24/24  OBJECTIVE:  Note: Objective measures were completed at Evaluation unless otherwise noted.  DIAGNOSTIC FINDINGS: 01/19/24 IMPRESSION:  1. Degenerative changes in the lower lumbar spine. 2. No acute abnormality in the lumbar spine.  PATIENT SURVEYS:  Modified Oswestry:  MODIFIED OSWESTRY DISABILITY SCALE  Date: 01/26/24 Score  Pain intensity 4 =  Pain medication provides me with little relief from pain.  2. Personal care (washing, dressing, etc.) 0 =  I can take care of myself normally without causing increased pain.  3. Lifting 0 = I can lift heavy weights without increased pain.  4. Walking 2 =  Pain prevents me from walking more than  mile.  5. Sitting 0 =  I can sit in any chair as long as I like.  6. Standing 4 =  Pain prevents me from standing more than 10 minutes.  7. Sleeping 0 = Pain does not prevent me from sleeping well.  8. Social Life 0 = My social life is normal and does not increase my pain.  9. Traveling 0 =  I can travel anywhere without increased pain.  10. Employment/ Homemaking 0 = My normal homemaking/job activities do not cause pain.  Total 10/50   Interpretation of scores: Score Category Description  0-20% Minimal Disability The patient can cope with most living activities. Usually no treatment is indicated apart from advice on lifting, sitting and exercise  21-40% Moderate Disability The patient experiences more pain and difficulty with sitting, lifting and standing. Travel and social life are more difficult and they may  be disabled from work. Personal care, sexual activity and sleeping are not grossly affected, and the patient can usually be managed by conservative means  41-60% Severe Disability Pain remains the main problem in this group, but activities of daily living are affected. These patients require a detailed investigation  61-80% Crippled Back pain impinges on all aspects of the patient's life. Positive intervention is required  81-100% Bed-bound  These patients are either bed-bound or exaggerating their symptoms  Bluford FORBES Zoe DELENA Karon DELENA, et al. Surgery versus conservative management of stable thoracolumbar fracture: the PRESTO feasibility RCT. [place]): [employer]; 2021 Nov. [facility] Technology Assessment, No. 25.62.) Appendix 3, Oswestry Disability Index category descriptors. Available from: [employer]cz  Minimally Clinically Important Difference (MCID) = 12.8%  COGNITION: Overall cognitive status: Within functional limits for tasks assessed     SENSATION: Patient reports no numbness or tingling currently, but has intermittent numbness in 5th toe of her left foot.  PALPATION: No tenderness to palpation reported  LUMBAR ROM:   AROM eval  Flexion 64; familiar pain  Extension 12  Right lateral flexion WFL   Left lateral flexion WFL   Right rotation WFL; slight twinge  Left rotation WFL; slight twinge   (Blank rows = not tested)  LOWER EXTREMITY ROM:     Active  Right eval Left eval  Hip flexion    Hip extension    Hip abduction    Hip adduction    Hip internal rotation    Hip external rotation    Knee flexion    Knee extension  Familiar pain reproduced with end range knee extension   Ankle dorsiflexion    Ankle plantarflexion    Ankle inversion    Ankle eversion     (Blank rows = not tested)  LOWER EXTREMITY MMT:    MMT Right eval Left eval  Hip flexion 4+/5 4/5  Hip extension    Hip abduction    Hip  adduction    Hip internal rotation    Hip external rotation  Knee flexion 4+/5 4+/5  Knee extension 5/5 5/5  Ankle dorsiflexion 4+/5 4+/5  Ankle plantarflexion    Ankle inversion    Ankle eversion     (Blank rows = not tested)  GAIT: Assistive device utilized: None Level of assistance: Complete Independence  TREATMENT DATE:    02/03/2024                                                                                                                           Nustep x 10 mins L5 Bridge 2x10 Dying Bug 2x10 Discussed AB bracing and movement patterns for work and home to decrease daily pain triggers Discussed walking program for at least 10 mins HEP handout given    PATIENT EDUCATION:  Education details: POC, healing, x-ray results, objective findings, and goals for physical therapy Person educated: Patient Education method: Explanation Education comprehension: verbalized understanding  HOME EXERCISE PROGRAM:   ASSESSMENT:  CLINICAL IMPRESSION: Pt arrived today doing about the same with LT LE radiating pain. Pt reports that the mornings are not  very bad, but the evenings are worse. Rx focused on Discussion and education on AB bracing and movement patterns with ADL's to decrease pain triggers. She was also instructed in and performed core activation exercises as well as HS stretching. ADD modified bird-dog if tolerated.  Try TM   Patient OBJECTIVE IMPAIRMENTS: decreased activity tolerance, decreased ROM, decreased strength, and pain.   ACTIVITY LIMITATIONS: bending and standing  PARTICIPATION LIMITATIONS: cleaning and community activity  PERSONAL FACTORS: Past/current experiences, Time since onset of injury/illness/exacerbation, and 1 comorbidity: allergies are also affecting patient's functional outcome.   REHAB POTENTIAL: Good  CLINICAL DECISION MAKING: Stable/uncomplicated  EVALUATION COMPLEXITY: Low   GOALS: Goals reviewed with patient? Yes  LONG TERM GOALS:  Target date: 02/23/24  Patient will be independent with her HEP.  Baseline:  Goal status: INITIAL  2.  Patient will be able to complete her daily activities without her familiar symptoms exceeding 5/10. Baseline:  Goal status: INITIAL  3.  Patient will improve her ODI score to 8% disability or less for improved perceived function with her daily activities.  Baseline:  Goal status: INITIAL  4.  Patient will report being able to stand for at least 15 minutes for improved function completing her daily activities.  Baseline:  Goal status: INITIAL  PLAN:  PT FREQUENCY: 2x/week  PT DURATION: 4 weeks  PLANNED INTERVENTIONS: 97164- PT Re-evaluation, 97750- Physical Performance Testing, 97110-Therapeutic exercises, 97530- Therapeutic activity, [id number]- Neuromuscular re-education, 97535- Self Care, [id number]- Manual therapy, Patient/Family education, Balance training, Joint mobilization, and Spinal mobilization.  PLAN FOR NEXT SESSION: Nustep, lumbar stabilization, lower, and bridging   Willford Rabideau,CHRIS, PTA 02/03/2024, 3:53 PM

## 2024-02-10 ENCOUNTER — Encounter: Payer: Self-pay | Admitting: *Deleted

## 2024-02-10 ENCOUNTER — Ambulatory Visit: Admitting: *Deleted

## 2024-02-10 DIAGNOSIS — M5416 Radiculopathy, lumbar region: Secondary | ICD-10-CM

## 2024-02-10 DIAGNOSIS — M5431 Sciatica, right side: Secondary | ICD-10-CM | POA: Diagnosis not present

## 2024-02-10 NOTE — Therapy (Signed)
 OUTPATIENT PHYSICAL THERAPY THORACOLUMBAR TREATMENT   Patient Name: Rachel Brooks MRN: [id number] DOB:November 11, 1960, 63 y.o., female Today's Date: 02/10/2024  END OF SESSION:  PT End of Session - 02/10/24 1431     Visit Number 3    Number of Visits 8    Date for PT Re-Evaluation 03/19/24    Authorization - Visit Number 4     PT Start Time 1432    PT Stop Time 1520    PT Time Calculation (min) 48 min          Past Medical History:  Diagnosis Date   Allergy    Anemia, unspecified 06/26/2015   10/1 IMO update   Prediabetes 05/17/2021   Past Surgical History:  Procedure Laterality Date   CESAREAN SECTION WITH BILATERAL TUBAL LIGATION  1995   COLONOSCOPY N/A 09/15/2019   Procedure: COLONOSCOPY;  Surgeon: Shaaron Lamar HERO, MD;  Location: [facility];  Service: Endoscopy;  Laterality: N/A;  1:00   Patient Active Problem List   Diagnosis Date Noted   Prediabetes 05/17/2021   Idiopathic peripheral neuropathy 03/19/2021   Post-menopausal 06/26/2015   REFERRING PROVIDER: Jolinda Norene HERO, DO   REFERRING DIAG: Right sided sciatica   Rationale for Evaluation and Treatment: Rehabilitation  THERAPY DIAG:  Radiculopathy, lumbar region  ONSET DATE: 2 months ago  SUBJECTIVE:                                                                                                                                                                                           SUBJECTIVE STATEMENT: Patient reports that she is doing some better than last time. 3-4/10 when standing   PERTINENT HISTORY:  Allergies  PAIN:  Are you having pain? Yes: NPRS scale: Current: 3/10 Best: 1/10 Worst: 8-9/10 Pain location: left low back radiating to her left foot Pain description: tingling (like a catch) and numbness in her 5th toe of her left foot Aggravating factors: standing (5 minutes at most) Relieving factors: stretching, touching her toes,   PRECAUTIONS: None  RED FLAGS: None   WEIGHT  BEARING RESTRICTIONS: No  FALLS:  Has patient fallen in last 6 months? No  LIVING ENVIRONMENT: Lives with: lives with their family Lives in: Mobile home Stairs: Yes: External: 4 steps; none Has following equipment at home: None  OCCUPATION: cleaning a building  PLOF: Independent  PATIENT GOALS: be able to walk longer, reduced pain, and be able to stand longer  NEXT MD VISIT: 06/24/24  OBJECTIVE:  Note: Objective measures were completed at Evaluation unless otherwise noted.  DIAGNOSTIC FINDINGS: 01/19/24 IMPRESSION: 1. Degenerative changes in the lower lumbar spine. 2. No  acute abnormality in the lumbar spine.  PATIENT SURVEYS:  Modified Oswestry:  MODIFIED OSWESTRY DISABILITY SCALE  Date: 01/26/24 Score  Pain intensity 4 =  Pain medication provides me with little relief from pain.  2. Personal care (washing, dressing, etc.) 0 =  I can take care of myself normally without causing increased pain.  3. Lifting 0 = I can lift heavy weights without increased pain.  4. Walking 2 =  Pain prevents me from walking more than  mile.  5. Sitting 0 =  I can sit in any chair as long as I like.  6. Standing 4 =  Pain prevents me from standing more than 10 minutes.  7. Sleeping 0 = Pain does not prevent me from sleeping well.  8. Social Life 0 = My social life is normal and does not increase my pain.  9. Traveling 0 =  I can travel anywhere without increased pain.  10. Employment/ Homemaking 0 = My normal homemaking/job activities do not cause pain.  Total 10/50   Interpretation of scores: Score Category Description  0-20% Minimal Disability The patient can cope with most living activities. Usually no treatment is indicated apart from advice on lifting, sitting and exercise  21-40% Moderate Disability The patient experiences more pain and difficulty with sitting, lifting and standing. Travel and social life are more difficult and they may be disabled from work. Personal care, sexual  activity and sleeping are not grossly affected, and the patient can usually be managed by conservative means  41-60% Severe Disability Pain remains the main problem in this group, but activities of daily living are affected. These patients require a detailed investigation  61-80% Crippled Back pain impinges on all aspects of the patient's life. Positive intervention is required  81-100% Bed-bound  These patients are either bed-bound or exaggerating their symptoms  Bluford FORBES Zoe DELENA Karon DELENA, et al. Surgery versus conservative management of stable thoracolumbar fracture: the PRESTO feasibility RCT. [place] ([place]): [employer]; 2021 Nov. [facility] Technology Assessment, No. 25.62.) Appendix 3, Oswestry Disability Index category descriptors. Available from: [employer]cz  Minimally Clinically Important Difference (MCID) = 12.8%  COGNITION: Overall cognitive status: Within functional limits for tasks assessed     SENSATION: Patient reports no numbness or tingling currently, but has intermittent numbness in 5th toe of her left foot.  PALPATION: No tenderness to palpation reported  LUMBAR ROM:   AROM eval  Flexion 64; familiar pain  Extension 12  Right lateral flexion WFL   Left lateral flexion WFL   Right rotation WFL; slight twinge  Left rotation WFL; slight twinge   (Blank rows = not tested)  LOWER EXTREMITY ROM:     Active  Right eval Left eval  Hip flexion    Hip extension    Hip abduction    Hip adduction    Hip internal rotation    Hip external rotation    Knee flexion    Knee extension  Familiar pain reproduced with end range knee extension   Ankle dorsiflexion    Ankle plantarflexion    Ankle inversion    Ankle eversion     (Blank rows = not tested)  LOWER EXTREMITY MMT:    MMT Right eval Left eval  Hip flexion 4+/5 4/5  Hip extension    Hip abduction    Hip adduction    Hip internal rotation    Hip external  rotation    Knee flexion 4+/5 4+/5  Knee extension 5/5 5/5  Ankle dorsiflexion 4+/5 4+/5  Ankle plantarflexion    Ankle inversion    Ankle eversion     (Blank rows = not tested)  GAIT: Assistive device utilized: None Level of assistance: Complete Independence  TREATMENT DATE:    02/10/2024                                                                                                                           TM at 1.8  MPH x 15   mins  Blue XTS Rows 2x10  (some pain) ,  Pulldowns 2x10 no increased pain Bridge 1x10  hold 10 secs Dying Bug 2x  6 each side hold 5-10 secs Standing bird-dog x 6 hold 5 secs each side  pain LT side Reviewed  AB bracing and movement patterns for work and home to decrease daily pain triggers  HEP handout given    PATIENT EDUCATION:  Education details: POC, healing, x-ray results, objective findings, and goals for physical therapy Person educated: Patient Education method: Explanation Education comprehension: verbalized understanding  HOME EXERCISE PROGRAM:   ASSESSMENT:  CLINICAL IMPRESSION: Pt arrived today doing some better with less pain. Rx focused on review of HEP as well as progressions and added  modified bird-dogs in QP with arm and leg raises separately and did well in QP position. Standing bird dog was also  performed, but Pt had LT sided pain and was not added to HEP.  Handout given for QP modified BD. Review HEP and add to HEP if needed.     Patient OBJECTIVE IMPAIRMENTS: decreased activity tolerance, decreased ROM, decreased strength, and pain.   ACTIVITY LIMITATIONS: bending and standing  PARTICIPATION LIMITATIONS: cleaning and community activity  PERSONAL FACTORS: Past/current experiences, Time since onset of injury/illness/exacerbation, and 1 comorbidity: allergies are also affecting patient's functional outcome.   REHAB POTENTIAL: Good  CLINICAL DECISION MAKING: Stable/uncomplicated  EVALUATION COMPLEXITY:  Low   GOALS: Goals reviewed with patient? Yes  LONG TERM GOALS: Target date: 02/23/24  Patient will be independent with her HEP.  Baseline:  Goal status: partially met  2.  Patient will be able to complete her daily activities without her familiar symptoms exceeding 5/10. Baseline:  Goal status: INITIAL  3.  Patient will improve her ODI score to 8% disability or less for improved perceived function with her daily activities.  Baseline:  Goal status: INITIAL  4.  Patient will report being able to stand for at least 15 minutes for improved function completing her daily activities.  Baseline:  Goal status: INITIAL  PLAN:  PT FREQUENCY: 2x/week  PT DURATION: 4 weeks  PLANNED INTERVENTIONS: 97164- PT Re-evaluation, 97750- Physical Performance Testing, 97110-Therapeutic exercises, 97530- Therapeutic activity, [id number]- Neuromuscular re-education, 97535- Self Care, [id number]- Manual therapy, Patient/Family education, Balance training, Joint mobilization, and Spinal mobilization.  PLAN FOR NEXT SESSION: Nustep, lumbar stabilization, lower, and bridging   DC to HEP due to 4 visit limit   Kailey Esquilin,CHRIS, PTA 02/10/2024, 4:13 PM

## 2024-02-17 ENCOUNTER — Ambulatory Visit

## 2024-02-17 DIAGNOSIS — M5416 Radiculopathy, lumbar region: Secondary | ICD-10-CM

## 2024-02-17 DIAGNOSIS — M5431 Sciatica, right side: Secondary | ICD-10-CM | POA: Diagnosis not present

## 2024-02-17 NOTE — Therapy (Signed)
 OUTPATIENT PHYSICAL THERAPY THORACOLUMBAR TREATMENT   Patient Name: Rachel Brooks MRN: [id number] DOB:23-Dec-1960, 63 y.o., female Today's Date: 02/17/2024  END OF SESSION:  PT End of Session - 02/17/24 1423     Visit Number 4    Number of Visits 8    Date for PT Re-Evaluation 03/19/24    PT Start Time 1430    PT Stop Time 1510    PT Time Calculation (min) 40 min    Activity Tolerance Patient tolerated treatment well    Behavior During Therapy [facility] for tasks assessed/performed           Past Medical History:  Diagnosis Date   Allergy    Anemia, unspecified 06/26/2015   10/1 IMO update   Prediabetes 05/17/2021   Past Surgical History:  Procedure Laterality Date   CESAREAN SECTION WITH BILATERAL TUBAL LIGATION  1995   COLONOSCOPY N/A 09/15/2019   Procedure: COLONOSCOPY;  Surgeon: Shaaron Lamar HERO, MD;  [facility];  Service: Endoscopy;  Laterality: N/A;  1:00   Patient Active Problem List   Diagnosis Date Noted   Prediabetes 05/17/2021   Idiopathic peripheral neuropathy 03/19/2021   Post-menopausal 06/26/2015   REFERRING PROVIDER: Jolinda Norene HERO, DO   REFERRING DIAG: Right sided sciatica   Rationale for Evaluation and Treatment: Rehabilitation  THERAPY DIAG:  Radiculopathy, lumbar region  ONSET DATE: 2 months ago  SUBJECTIVE:                                                                                                                                                                                           SUBJECTIVE STATEMENT: Patient reports that she feels a little better today. She notes that her HEP is helping.   PERTINENT HISTORY:  Allergies  PAIN:  Are you having pain? Yes: NPRS scale: Current: 3/10 Best: 1/10 Worst: 8-9/10 Pain location: left low back radiating to her left foot Pain description: tingling (like a catch) and numbness in her 5th toe of her left foot Aggravating factors: standing (5 minutes at most) Relieving  factors: stretching, touching her toes,   PRECAUTIONS: None  RED FLAGS: None   WEIGHT BEARING RESTRICTIONS: No  FALLS:  Has patient fallen in last 6 months? No  LIVING ENVIRONMENT: Lives with: lives with their family Lives in: Mobile home Stairs: Yes: External: 4 steps; none Has following equipment at home: None  OCCUPATION: cleaning a building  PLOF: Independent  PATIENT GOALS: be able to walk longer, reduced pain, and be able to stand longer  NEXT MD VISIT: 06/24/24  OBJECTIVE:  Note: Objective measures were completed at Evaluation unless otherwise noted.  DIAGNOSTIC  FINDINGS: 01/19/24 lumbar x-ray IMPRESSION: 1. Degenerative changes in the lower lumbar spine. 2. No acute abnormality in the lumbar spine.  PATIENT SURVEYS:  Modified Oswestry:  MODIFIED OSWESTRY DISABILITY SCALE  Date: 01/26/24 Score  Pain intensity 4 =  Pain medication provides me with little relief from pain.  2. Personal care (washing, dressing, etc.) 0 =  I can take care of myself normally without causing increased pain.  3. Lifting 0 = I can lift heavy weights without increased pain.  4. Walking 2 =  Pain prevents me from walking more than  mile.  5. Sitting 0 =  I can sit in any chair as long as I like.  6. Standing 4 =  Pain prevents me from standing more than 10 minutes.  7. Sleeping 0 = Pain does not prevent me from sleeping well.  8. Social Life 0 = My social life is normal and does not increase my pain.  9. Traveling 0 =  I can travel anywhere without increased pain.  10. Employment/ Homemaking 0 = My normal homemaking/job activities do not cause pain.  Total 10/50   Interpretation of scores: Score Category Description  0-20% Minimal Disability The patient can cope with most living activities. Usually no treatment is indicated apart from advice on lifting, sitting and exercise  21-40% Moderate Disability The patient experiences more pain and difficulty with sitting, lifting and standing.  Travel and social life are more difficult and they may be disabled from work. Personal care, sexual activity and sleeping are not grossly affected, and the patient can usually be managed by conservative means  41-60% Severe Disability Pain remains the main problem in this group, but activities of daily living are affected. These patients require a detailed investigation  61-80% Crippled Back pain impinges on all aspects of the patient's life. Positive intervention is required  81-100% Bed-bound  These patients are either bed-bound or exaggerating their symptoms  Bluford FORBES Zoe DELENA Karon DELENA, et al. Surgery versus conservative management of stable thoracolumbar fracture: the PRESTO feasibility RCT. [place] ([place]): [employer]; 2021 Nov. [facility] Technology Assessment, No. 25.62.) Appendix 3, Oswestry Disability Index category descriptors. Available from: [employer]el[employer]cz  Minimally Clinically Important Difference (MCID) = 12.8%  COGNITION: Overall cognitive status: Within functional limits for tasks assessed     SENSATION: Patient reports no numbness or tingling currently, but has intermittent numbness in 5th toe of her left foot.  PALPATION: No tenderness to palpation reported  LUMBAR ROM:   AROM eval  Flexion 64; familiar pain  Extension 12  Right lateral flexion WFL   Left lateral flexion WFL   Right rotation WFL; slight twinge  Left rotation WFL; slight twinge   (Blank rows = not tested)  LOWER EXTREMITY ROM:     Active  Right eval Left eval  Hip flexion    Hip extension    Hip abduction    Hip adduction    Hip internal rotation    Hip external rotation    Knee flexion    Knee extension  Familiar pain reproduced with end range knee extension   Ankle dorsiflexion    Ankle plantarflexion    Ankle inversion    Ankle eversion     (Blank rows = not tested)  LOWER EXTREMITY MMT:    MMT Right eval Left eval  Hip flexion  4+/5 4/5  Hip extension    Hip abduction    Hip adduction    Hip internal rotation  Hip external rotation    Knee flexion 4+/5 4+/5  Knee extension 5/5 5/5  Ankle dorsiflexion 4+/5 4+/5  Ankle plantarflexion    Ankle inversion    Ankle eversion     (Blank rows = not tested)  GAIT: Assistive device utilized: None Level of assistance: Complete Independence  TREATMENT DATE:                                       02/17/24 EXERCISE LOG  Exercise Repetitions and Resistance Comments  Treadmill  2.2 mph x 15 minutes @ 0% incline   Resisted pull down  Green t-band x 2 x 12 reps    Bridge  15 reps  With bridge progressions   SLR w/ hip ABD  15 reps each    Goal assessment     Blank cell = exercise not performed today   02/10/2024                                                                                                                           TM at 1.8  MPH x 15   mins  Blue XTS Rows 2x10  (some pain) ,  Pulldowns 2x10 no increased pain Bridge 1x10  hold 10 secs Dying Bug 2x  6 each side hold 5-10 secs Standing bird-dog x 6 hold 5 secs each side  pain LT side Reviewed  AB bracing and movement patterns for work and home to decrease daily pain triggers  HEP handout given    PATIENT EDUCATION:  Education details: healing, progress with physical therapy, contacting her referring physician, and HEP Person educated: Patient Education method: Explanation Education comprehension: verbalized understanding  HOME EXERCISE PROGRAM:   ASSESSMENT:  CLINICAL IMPRESSION: Patient has made moderate progress with skilled physical therapy as evidenced by her subjective reports, functional mobility, and progress toward her goals. She was able to meet her goals for reduced pain. However, her pain still limits her ability to stand as evidenced by her ODI score. Her HEP was reviewed and updated. She reported feeling comfortable with these interventions. She felt comfortable being discharged  today.     PHYSICAL THERAPY DISCHARGE SUMMARY  Visits from Start of Care: 4  Current functional level related to goals / functional outcomes: Patient was able to partially meet her goals for skilled physicla therapy.    Remaining deficits: Pain    Education / Equipment: HEP   Patient agrees to discharge. Patient goals were partially met. Patient is being discharged due to insurance authorization.   OBJECTIVE IMPAIRMENTS: decreased activity tolerance, decreased ROM, decreased strength, and pain.   ACTIVITY LIMITATIONS: bending and standing  PARTICIPATION LIMITATIONS: cleaning and community activity  PERSONAL FACTORS: Past/current experiences, Time since onset of injury/illness/exacerbation, and 1 comorbidity: allergies are also affecting patient's functional outcome.   REHAB POTENTIAL: Good  CLINICAL DECISION MAKING: Stable/uncomplicated  EVALUATION COMPLEXITY: Low  GOALS: Goals reviewed with patient? Yes  LONG TERM GOALS: Target date: 02/23/24  Patient will be independent with her HEP.  Baseline:  Goal status: MET  2.  Patient will be able to complete her daily activities without her familiar symptoms exceeding 5/10. Baseline: 5/10 at most, but depends on the day Goal status: MET  3.  Patient will improve her ODI score to 8% disability or less for improved perceived function with her daily activities.  Baseline: 16% disability Goal status: NOT MET  4.  Patient will report being able to stand for at least 15 minutes for improved function completing her daily activities.  Baseline: 5 minutes at most when standing still  Goal status: NOT MET   PLAN:  PT FREQUENCY: 2x/week  PT DURATION: 4 weeks  PLANNED INTERVENTIONS: 97164- PT Re-evaluation, 97750- Physical Performance Testing, 97110-Therapeutic exercises, 97530- Therapeutic activity, [id number]- Neuromuscular re-education, 97535- Self Care, [id number]- Manual therapy, Patient/Family education, Balance training, Joint  mobilization, and Spinal mobilization.  PLAN FOR NEXT SESSION: Nustep, lumbar stabilization, lower, and bridging   DC to HEP due to 4 visit limit   Lacinda JAYSON Fass, PT 02/17/2024, 3:27 PM

## 2024-02-18 ENCOUNTER — Telehealth: Payer: Self-pay

## 2024-02-18 DIAGNOSIS — M5431 Sciatica, right side: Secondary | ICD-10-CM

## 2024-02-18 DIAGNOSIS — M5432 Sciatica, left side: Secondary | ICD-10-CM

## 2024-02-18 NOTE — Telephone Encounter (Signed)
 Copied from CRM [id number]. Topic: Clinical - Medical Advice >> Feb 18, 2024 11:50 AM Carlatta H wrote: Reason for CRM: Patient finished physical therapy yesterday and has improved but not enough to where the patient is comfortable//Please call the patient to advise on next steps

## 2024-02-18 NOTE — Telephone Encounter (Signed)
 Can place referral to ortho.

## 2024-02-18 NOTE — Telephone Encounter (Signed)
 Patient returning call to [facility]. Patient missed call. Psatient requesting callback, [phone]

## 2024-02-19 ENCOUNTER — Telehealth: Payer: Self-pay

## 2024-02-19 NOTE — Telephone Encounter (Signed)
 Copied from CRM [id number]. Topic: General - Other >> Feb 19, 2024 12:03 PM Rachel Brooks ORN wrote: Reason for CRM: Patient is requesting that Arnulfo give her a call back. CB #: [phone].

## 2024-02-19 NOTE — Addendum Note (Signed)
 Addended by: Quanesha Klimaszewski G on: 02/19/2024 02:39 PM   Modules accepted: Orders

## 2024-02-19 NOTE — Telephone Encounter (Signed)
 Please see previous telephone encounter regarding this, will close encounter.

## 2024-02-19 NOTE — Telephone Encounter (Signed)
 Pt called back and is ok with ortho referral. Ortho referral placed an pt aware.

## 2024-02-26 ENCOUNTER — Encounter: Payer: Self-pay | Admitting: Orthopaedic Surgery

## 2024-02-26 ENCOUNTER — Ambulatory Visit: Admitting: Orthopaedic Surgery

## 2024-02-26 VITALS — BP 129/82 | HR 86 | Ht 61.0 in | Wt 153.2 lb

## 2024-02-26 DIAGNOSIS — M545 Low back pain, unspecified: Secondary | ICD-10-CM | POA: Diagnosis not present

## 2024-02-26 NOTE — Patient Instructions (Signed)
 Order has been placed to [facility] for MRI please call [phone] to schedule this appt.   Follow up with Dr, Brenna in 3 weeks.

## 2024-02-26 NOTE — Progress Notes (Signed)
 Subjective:    Patient ID: Rachel Brooks, female    DOB: July 18, 1961, 63 y.o.   MRN: [id number]  HPI She has had lower back pain for over three months that is getting worse.  She saw her primary care in [place] in early June for her lower back pain. She was begun on ibuprofen and gabapentin .  She continued to have pain.  X-rays of the lumbar spine were done 01-24-24 showing: IMPRESSION: 1. Degenerative changes in the lower lumbar spine. 2. No acute abnormality in the lumbar spine.  She has been to PT and just completed a series of it yesterday.  It has not been helpful.  She awakens at night with pain in the back.  She has no paresthesias but has had spasms.  She has been on Zanaflex  for this but it was not that helpful.  She has no bowel or bladder problems. She has no trauma.     Review of Systems  Constitutional:  Positive for activity change.  Musculoskeletal:  Positive for arthralgias, back pain and myalgias.  All other systems reviewed and are negative. For Review of Systems, all other systems reviewed and are negative.  The following is a summary of the past history medically, past history surgically, known current medicines, social history and family history.  This information is gathered electronically by the computer from prior information and documentation.  I review this each visit and have found including this information at this point in the chart is beneficial and informative.   Past Medical History:  Diagnosis Date   Allergy    Anemia, unspecified 06/26/2015   10/1 IMO update   Prediabetes 05/17/2021    Past Surgical History:  Procedure Laterality Date   CESAREAN SECTION WITH BILATERAL TUBAL LIGATION  1995   COLONOSCOPY N/A 09/15/2019   Procedure: COLONOSCOPY;  Surgeon: Shaaron Lamar HERO, MD;  [facility];  Service: Endoscopy;  Laterality: N/A;  1:00    Current Outpatient Medications on File Prior to Visit  Medication Sig Dispense Refill   OPZELURA  1.5 % CREA Apply topically.     finasteride (PROSCAR) 5 MG tablet Take 5 mg by mouth daily.     gabapentin  (NEURONTIN ) 300 MG capsule TAKE 1 CAPSULE BY MOUTH THREE TIMES A DAY 90 capsule 5   minoxidil (LONITEN) 2.5 MG tablet Take 2.5 mg by mouth daily.     tacrolimus (PROTOPIC) 0.1 % ointment Apply topically.     tiZANidine  (ZANAFLEX ) 2 MG tablet Take 1 tablet (2 mg total) by mouth every 8 (eight) hours as needed for muscle spasms. 30 tablet 0   No current facility-administered medications on file prior to visit.    Social History   Socioeconomic History   Marital status: Divorced    Spouse name: Not on file   Number of children: Not on file   Years of education: Not on file   Highest education level: Not on file  Occupational History   Not on file  Tobacco Use   Smoking status: Never   Smokeless tobacco: Never  Vaping Use   Vaping status: Never Used  Substance and Sexual Activity   Alcohol use: Not Currently   Drug use: Never   Sexual activity: Not Currently  Other Topics Concern   Not on file  Social History Narrative   Not on file   Social Drivers of Health   Financial Resource Strain: Not on file  Food Insecurity: Not on file  Transportation Needs: Not on file  Physical Activity: Not on file  Stress: No Stress Concern Present (06/22/2019)   Received from [facility] - Occupational Stress Questionnaire    Feeling of Stress : Not at all  Social Connections: Unknown (12/03/2021)   Received from [facility]   Social Network    Social Network: Not on file  Intimate Partner Violence: Unknown (10/25/2021)   Received from [facility]   HITS    Physically Hurt: Not on file    Insult or Talk Down To: Not on file    Threaten Physical Harm: Not on file    Scream or Curse: Not on file    Family History  Problem Relation Age of Onset   CAD Mother        95% blockages had cardiac catherization   Hypertension Mother     Cataracts Mother    Arthritis Mother    Lung cancer Father    Colon polyps Sister    Breast cancer Neg Hx     BP 129/82   Pulse 86   Ht 5' 1 (1.549 m)   Wt 153 lb 3.2 oz (69.5 kg)   BMI 28.95 kg/m   Body mass index is 28.95 kg/m.      Objective:   Physical Exam Vitals and nursing note reviewed. Exam conducted with a chaperone present.  Constitutional:      Appearance: She is well-developed.  HENT:     Head: Normocephalic and atraumatic.  Eyes:     Conjunctiva/sclera: Conjunctivae normal.     Pupils: Pupils are equal, round, and reactive to light.  Cardiovascular:     Rate and Rhythm: Normal rate and regular rhythm.  Pulmonary:     Effort: Pulmonary effort is normal.  Abdominal:     Palpations: Abdomen is soft.  Musculoskeletal:       Arms:     Cervical back: Normal range of motion and neck supple.  Skin:    General: Skin is warm and dry.  Neurological:     Mental Status: She is alert and oriented to person, place, and time.     Cranial Nerves: No cranial nerve deficit.     Motor: No abnormal muscle tone.     Coordination: Coordination normal.     Deep Tendon Reflexes: Reflexes are normal and symmetric. Reflexes normal.  Psychiatric:        Behavior: Behavior normal.        Thought Content: Thought content normal.        Judgment: Judgment normal.    I have reviewed the notes from [place] and the X-rays.  I have independently reviewed and interpreted x-rays of this patient done at another site by another physician or qualified health professional.        Assessment & Plan:   Encounter Diagnosis  Name Primary?   Lumbar pain Yes   I feel she needs a MRI.  She has not improved with medicine, rest, PT, exercises.  Return in three weeks.  I will begin Naprosyn 500 po bid pc.  Call if any problem.  Precautions discussed.  Electronically Signed Lemond Stable, MD 8/7/20251:50 PM

## 2024-03-01 ENCOUNTER — Telehealth: Payer: Self-pay | Admitting: Orthopaedic Surgery

## 2024-03-01 MED ORDER — NAPROXEN 500 MG PO TABS: 500.0000 mg | ORAL_TABLET | Freq: Two times a day (BID) | ORAL | 5 refills | Status: AC

## 2024-03-01 NOTE — Telephone Encounter (Signed)
 Dr. Janae pt - pt lvm stating she was seen Thursday and was prescribed Naproxen  and it was never sent to her pharmacy. She would like a call back.  [phone]

## 2024-03-01 NOTE — Addendum Note (Signed)
 Addended by: BRENNA NORLEEN ORN on: 03/01/2024 05:38 PM   Modules accepted: Orders

## 2024-03-04 ENCOUNTER — Ambulatory Visit (HOSPITAL_COMMUNITY)
Admission: RE | Admit: 2024-03-04 | Discharge: 2024-03-04 | Disposition: A | Discharge status: Home / Self Care | Source: Ambulatory Visit | Attending: Orthopaedic Surgery | Admitting: Orthopaedic Surgery

## 2024-03-04 DIAGNOSIS — M47816 Spondylosis without myelopathy or radiculopathy, lumbar region: Secondary | ICD-10-CM | POA: Diagnosis not present

## 2024-03-04 DIAGNOSIS — M545 Low back pain, unspecified: Secondary | ICD-10-CM | POA: Insufficient documentation

## 2024-03-04 DIAGNOSIS — M47817 Spondylosis without myelopathy or radiculopathy, lumbosacral region: Secondary | ICD-10-CM | POA: Diagnosis not present

## 2024-03-04 DIAGNOSIS — M5126 Other intervertebral disc displacement, lumbar region: Secondary | ICD-10-CM | POA: Diagnosis not present

## 2024-03-04 DIAGNOSIS — M5137 Other intervertebral disc degeneration, lumbosacral region with discogenic back pain only: Secondary | ICD-10-CM | POA: Diagnosis not present

## 2024-03-12 ENCOUNTER — Encounter: Payer: Self-pay | Admitting: Radiology

## 2024-03-17 ENCOUNTER — Ambulatory Visit: Admitting: Orthopaedic Surgery

## 2024-03-17 ENCOUNTER — Encounter: Payer: Self-pay | Admitting: Orthopaedic Surgery

## 2024-03-17 DIAGNOSIS — M545 Low back pain, unspecified: Secondary | ICD-10-CM | POA: Diagnosis not present

## 2024-03-17 NOTE — Progress Notes (Signed)
 I feel a little better.  She had MRI of the lumbar spine showing: IMPRESSION: 1. Mild canal stenosis at L4-L5 secondary to disc bulge and facet arthropathy. 2. Mild canal stenosis at L5-S1 secondary to central disc protrusion. 3. Mild bilateral foraminal stenosis at L5-S1 secondary to disc bulge and facet arthropathy.   I have explained the findings to her.  She does not need surgery at this time.  I have independently reviewed the MRI.    She is taking the Naprosyn  and it helps but she has had some stomach issues.  I told her to stop the Rx Naprosyn  and take one Aleve  twice a day.  She is active and is walking more.  Her pain is much less.  ROM of the back is full, NV intact, muscle tone and strength are normal, gait normal.  Encounter Diagnosis  Name Primary?   Lumbar pain Yes   Return in one month.  Call and cancel if doing well.  Call if any problem.  Precautions discussed.  Electronically Signed Lemond Stable, MD 8/27/20252:14 PM

## 2024-04-14 ENCOUNTER — Encounter: Payer: Self-pay | Admitting: Orthopaedic Surgery

## 2024-04-14 ENCOUNTER — Ambulatory Visit: Admitting: Orthopaedic Surgery

## 2024-04-14 DIAGNOSIS — M545 Low back pain, unspecified: Secondary | ICD-10-CM | POA: Diagnosis not present

## 2024-04-14 NOTE — Progress Notes (Signed)
 I am the same.  She still has lower back pain at times.  She has good and bad days. She has no new trauma.  ROM is good, muscle tone and strength norma, NV intact, normal gait.  Encounter Diagnosis  Name Primary?   Lumbar pain Yes   She would like to see a chiropractor and I have no problem with that.  I have informed the patient I will be retiring from medical practice and from this office on April 22, 2024.  The patient has been offered continuing care with Dr. Margrette or Dr. Onesimo of this office.  The patient may choose another provider and the records will be forwarded after proper signature and notification.  Patient understands and agrees.  She would like to return prn.  Call if any problem.  Precautions discussed.  Electronically Signed Lemond Stable, MD 9/24/20252:22 PM

## 2024-05-24 ENCOUNTER — Encounter: Payer: Self-pay | Admitting: Radiology

## 2024-06-10 DIAGNOSIS — L649 Androgenic alopecia, unspecified: Secondary | ICD-10-CM | POA: Diagnosis not present

## 2024-06-10 DIAGNOSIS — L80 Vitiligo: Secondary | ICD-10-CM | POA: Diagnosis not present

## 2024-06-24 ENCOUNTER — Ambulatory Visit: Payer: Self-pay | Admitting: Family Medicine

## 2024-06-24 ENCOUNTER — Encounter: Payer: Self-pay | Admitting: Family Medicine

## 2024-06-24 VITALS — BP 132/81 | HR 89 | Temp 98.2°F | Ht 61.0 in | Wt 152.0 lb

## 2024-06-24 DIAGNOSIS — M5432 Sciatica, left side: Secondary | ICD-10-CM

## 2024-06-24 DIAGNOSIS — Z0001 Encounter for general adult medical examination with abnormal findings: Secondary | ICD-10-CM | POA: Diagnosis not present

## 2024-06-24 DIAGNOSIS — Z1329 Encounter for screening for other suspected endocrine disorder: Secondary | ICD-10-CM | POA: Diagnosis not present

## 2024-06-24 DIAGNOSIS — R7303 Prediabetes: Secondary | ICD-10-CM

## 2024-06-24 DIAGNOSIS — E663 Overweight: Secondary | ICD-10-CM | POA: Insufficient documentation

## 2024-06-24 DIAGNOSIS — Z13228 Encounter for screening for other metabolic disorders: Secondary | ICD-10-CM | POA: Diagnosis not present

## 2024-06-24 DIAGNOSIS — Z0000 Encounter for general adult medical examination without abnormal findings: Secondary | ICD-10-CM

## 2024-06-24 DIAGNOSIS — Z130 Encounter for screening for diseases of the blood and blood-forming organs and certain disorders involving the immune mechanism: Secondary | ICD-10-CM | POA: Diagnosis not present

## 2024-06-24 LAB — BAYER DCA HB A1C WAIVED: HB A1C (BAYER DCA - WAIVED): 5.2 % (ref 4.8–5.6)

## 2024-06-24 NOTE — Patient Instructions (Signed)

## 2024-06-24 NOTE — Progress Notes (Signed)
 Complete physical exam  Patient: Rachel Brooks   DOB: [birth year]   63 y.o. Female  MRN: [id number]  Subjective:    Chief Complaint  Patient presents with   Annual Exam    Rachel Brooks is a 63 y.o. female who presents today for a complete physical exam. She reports consuming a general diet. She is active at work. She generally feels well. She reports sleeping well. She does not have additional problems to discuss today.   She reports that her sciatica has improved. She has some tingling in her toes but no other symptoms.   Most recent fall risk assessment:    06/24/2024    8:01 AM  Fall Risk   Falls in the past year? 0     Most recent depression screenings:    06/24/2024    8:01 AM 12/22/2023    7:55 AM  PHQ 2/9 Scores  PHQ - 2 Score 0 0  PHQ- 9 Score 0 0      Data saved with a previous flowsheet row definition    Vision:Not within last year  and Dental: No current dental problems and Receives regular dental care    Patient Care Team: Joesph Annabella HERO, FNP as PCP - General (Family Medicine) May Commander, MD as Referring Physician (Obstetrics and Gynecology) Shaaron Lamar HERO, MD as Consulting Physician (Gastroenterology)   Outpatient Medications Prior to Visit  Medication Sig   finasteride (PROSCAR) 5 MG tablet Take 5 mg by mouth daily.   minoxidil (LONITEN) 2.5 MG tablet Take 2.5 mg by mouth daily.   naproxen  (NAPROSYN ) 500 MG tablet Take 1 tablet (500 mg total) by mouth 2 (two) times daily with a meal.   OPZELURA 1.5 % CREA Apply topically.   RESTASIS 0.05 % ophthalmic emulsion    [DISCONTINUED] gabapentin  (NEURONTIN ) 300 MG capsule TAKE 1 CAPSULE BY MOUTH THREE TIMES A DAY   [DISCONTINUED] tacrolimus (PROTOPIC) 0.1 % ointment Apply topically.   [DISCONTINUED] tiZANidine  (ZANAFLEX ) 2 MG tablet Take 1 tablet (2 mg total) by mouth every 8 (eight) hours as needed for muscle spasms.   No facility-administered medications prior to visit.    ROS Negative  unless specially indicated above in HPI.     Objective:     BP 132/81   Pulse 89   Temp 98.2 F (36.8 C) (Temporal)   Ht 5' 1 (1.549 m)   Wt 152 lb (68.9 kg)   SpO2 100%   BMI 28.72 kg/m  Wt Readings from Last 3 Encounters:  06/24/24 152 lb (68.9 kg)  02/26/24 153 lb 3.2 oz (69.5 kg)  12/22/23 155 lb (70.3 kg)      Physical Exam Vitals and nursing note reviewed.  Constitutional:      General: She is not in acute distress.    Appearance: Normal appearance. She is not ill-appearing, toxic-appearing or diaphoretic.  HENT:     Head: Normocephalic.     Right Ear: Tympanic membrane, ear canal and external ear normal.     Left Ear: Tympanic membrane, ear canal and external ear normal.     Nose: Nose normal.     Mouth/Throat:     Mouth: Mucous membranes are moist.     Pharynx: Oropharynx is clear.  Eyes:     Extraocular Movements: Extraocular movements intact.     Conjunctiva/sclera: Conjunctivae normal.     Pupils: Pupils are equal, round, and reactive to light.  Cardiovascular:     Rate and Rhythm: Normal rate and regular  rhythm.     Pulses: Normal pulses.     Heart sounds: Normal heart sounds. No murmur heard.    No friction rub. No gallop.  Pulmonary:     Effort: Pulmonary effort is normal.     Breath sounds: Normal breath sounds.  Abdominal:     General: Bowel sounds are normal. There is no distension.     Palpations: Abdomen is soft. There is no mass.     Tenderness: There is no abdominal tenderness. There is no guarding.  Musculoskeletal:        General: No tenderness.     Cervical back: Normal range of motion and neck supple. No tenderness.     Right lower leg: No edema.     Left lower leg: No edema.  Skin:    General: Skin is warm and dry.     Capillary Refill: Capillary refill takes less than 2 seconds.     Findings: No lesion or rash.  Neurological:     General: No focal deficit present.     Mental Status: She is alert and oriented to person, place,  and time.     Cranial Nerves: No cranial nerve deficit.     Motor: No weakness.     Gait: Gait normal.  Psychiatric:        Mood and Affect: Mood normal.        Behavior: Behavior normal.        Thought Content: Thought content normal.        Judgment: Judgment normal.      No results found for any visits on 06/24/24.     Assessment & Plan:    Routine Health Maintenance and Physical Exam  Rachel Brooks was seen today for annual exam.  Diagnoses and all orders for this visit:  Routine general medical examination at a health care facility  Prediabetes -     Bayer DCA Hb A1c Waived  Overweight Weight trending down. Diet, exercise, weight loss. Fasting labs pending.  -     CBC with Differential/Platelet -     CMP14+EGFR -     Lipid panel  Screening for endocrine, metabolic and immunity disorder -     CBC with Differential/Platelet -     CMP14+EGFR -     TSH  Sciatica, left side Improved with minimal symptoms.    Immunization History  Administered Date(s) Administered   Influenza Inj Mdck Quad Pf 04/16/2019   Influenza,inj,Quad PF,6+ Mos 05/02/2020, 04/09/2021, 05/08/2022   Moderna SARS-COV2 Booster Vaccination 04/03/2022   Moderna Sars-Covid-2 Vaccination 10/11/2019, 11/08/2019, 06/04/2020, 01/05/2021   Tdap 12/02/2016   Zoster Recombinant(Shingrix ) 05/16/2021, 10/12/2021    Health Maintenance  Topic Date Due   Influenza Vaccine  10/19/2024 (Originally 02/20/2024)   Pneumococcal Vaccine: 50+ Years (1 of 1 - PCV) 06/24/2025 (Originally 12/24/2009)   COVID-19 Vaccine (6 - 2025-26 season) 07/10/2025 (Originally 03/22/2024)   Mammogram  08/12/2025   Cervical Cancer Screening (HPV/Pap Cotest)  05/16/2026   DTaP/Tdap/Td (2 - Td or Tdap) 12/03/2026   Colonoscopy  09/14/2029   Hepatitis C Screening  Completed   HIV Screening  Completed   Zoster Vaccines- Shingrix   Completed   Hepatitis B Vaccines 19-59 Average Risk  Aged Out   HPV VACCINES  Aged Out   Meningococcal B  Vaccine  Aged Out    Discussed health benefits of physical activity, and encouraged her to engage in regular exercise appropriate for her age and condition.  Problem List Items Addressed This Visit  Other   Prediabetes   Relevant Orders   Bayer DCA Hb A1c Waived   Overweight   Relevant Orders   CBC with Differential/Platelet   CMP14+EGFR   Lipid panel   Other Visit Diagnoses       Routine general medical examination at a health care facility    -  Primary     Screening for endocrine, metabolic and immunity disorder       Relevant Orders   CBC with Differential/Platelet   CMP14+EGFR   TSH     Sciatica, left side          Return in 6 months (on 12/23/2024) for chronic follow up.   The patient indicates understanding of these issues and agrees with the plan.  Annabella CHRISTELLA Search, FNP

## 2024-06-25 ENCOUNTER — Ambulatory Visit: Payer: Self-pay | Admitting: Family Medicine

## 2024-06-25 LAB — LIPID PANEL
Chol/HDL Ratio: 2.8 ratio (ref 0.0–4.4)
Cholesterol, Total: 153 mg/dL (ref 100–199)
HDL: 54 mg/dL (ref >39)
LDL Chol Calc (NIH): 88 mg/dL (ref 0–99)
Triglycerides: 49 mg/dL (ref 0–149)
VLDL Cholesterol Cal: 11 mg/dL (ref 5–40)

## 2024-06-25 LAB — CBC WITH DIFFERENTIAL/PLATELET
Basophils Absolute: 0 x10E3/uL (ref 0.0–0.2)
Basos: 1 %
EOS (ABSOLUTE): 0.1 x10E3/uL (ref 0.0–0.4)
Eos: 3 %
Hematocrit: 38.3 % (ref 34.0–46.6)
Hemoglobin: 12.6 g/dL (ref 11.1–15.9)
Immature Grans (Abs): 0 x10E3/uL (ref 0.0–0.1)
Immature Granulocytes: 0 %
Lymphocytes Absolute: 1.3 x10E3/uL (ref 0.7–3.1)
Lymphs: 44 %
MCH: 30.7 pg (ref 26.6–33.0)
MCHC: 32.9 g/dL (ref 31.5–35.7)
MCV: 93 fL (ref 79–97)
Monocytes Absolute: 0.5 x10E3/uL (ref 0.1–0.9)
Monocytes: 16 %
Neutrophils Absolute: 1.1 x10E3/uL — ABNORMAL LOW (ref 1.4–7.0)
Neutrophils: 36 %
Platelets: 290 x10E3/uL (ref 150–450)
RBC: 4.11 x10E6/uL (ref 3.77–5.28)
RDW: 11.7 % (ref 11.7–15.4)
WBC: 3 x10E3/uL — ABNORMAL LOW (ref 3.4–10.8)

## 2024-06-25 LAB — CMP14+EGFR
ALT: 20 IU/L (ref 0–32)
AST: 24 IU/L (ref 0–40)
Albumin: 4.1 g/dL (ref 3.9–4.9)
Alkaline Phosphatase: 99 IU/L (ref 49–135)
BUN/Creatinine Ratio: 10 — ABNORMAL LOW (ref 12–28)
BUN: 10 mg/dL (ref 8–27)
Bilirubin Total: 0.6 mg/dL (ref 0.0–1.2)
CO2: 22 mmol/L (ref 20–29)
Calcium: 8.9 mg/dL (ref 8.7–10.3)
Chloride: 103 mmol/L (ref 96–106)
Creatinine, Ser: 0.99 mg/dL (ref 0.57–1.00)
Globulin, Total: 3.2 g/dL (ref 1.5–4.5)
Glucose: 103 mg/dL — ABNORMAL HIGH (ref 70–99)
Potassium: 3.8 mmol/L (ref 3.5–5.2)
Sodium: 139 mmol/L (ref 134–144)
Total Protein: 7.3 g/dL (ref 6.0–8.5)
eGFR: 64 mL/min/1.73 (ref >59)

## 2024-06-25 LAB — TSH: TSH: 1.29 u[IU]/mL (ref 0.450–4.500)

## 2024-06-29 ENCOUNTER — Other Ambulatory Visit: Payer: Self-pay | Admitting: Family Medicine

## 2024-06-29 DIAGNOSIS — Z1231 Encounter for screening mammogram for malignant neoplasm of breast: Secondary | ICD-10-CM

## 2024-07-01 ENCOUNTER — Encounter: Payer: Self-pay | Admitting: *Deleted

## 2024-08-30 ENCOUNTER — Ambulatory Visit

## 2024-12-23 ENCOUNTER — Ambulatory Visit: Admitting: Family Medicine

## 2025-06-29 ENCOUNTER — Encounter: Admitting: Family Medicine

## 2025-07-04 ENCOUNTER — Encounter: Admitting: Family Medicine
